# Patient Record
Sex: Female | Born: 1950 | Race: White | Hispanic: No | Marital: Married | Smoking: Never smoker
Health system: Southern US, Community
[De-identification: ages and names within clinical notes are randomized; demographics above are authoritative.]

## PROBLEM LIST (undated history)

## (undated) DIAGNOSIS — E785 Hyperlipidemia, unspecified: Secondary | ICD-10-CM

## (undated) DIAGNOSIS — K219 Gastro-esophageal reflux disease without esophagitis: Secondary | ICD-10-CM

## (undated) DIAGNOSIS — I10 Essential (primary) hypertension: Secondary | ICD-10-CM

## (undated) HISTORY — DX: Essential (primary) hypertension: I10

## (undated) HISTORY — DX: Gastro-esophageal reflux disease without esophagitis: K21.9

## (undated) HISTORY — PX: BUNIONECTOMY: SHX129

## (undated) HISTORY — PX: WRIST SURGERY: SHX841

## (undated) HISTORY — PX: ABDOMINAL HYSTERECTOMY: SHX81

## (undated) HISTORY — DX: Hyperlipidemia, unspecified: E78.5

---

## 1999-05-24 ENCOUNTER — Other Ambulatory Visit: Admission: RE | Admit: 1999-05-24 | Discharge: 1999-05-24 | Payer: Self-pay | Admitting: Family Medicine

## 1999-06-23 ENCOUNTER — Ambulatory Visit (HOSPITAL_COMMUNITY): Admission: RE | Admit: 1999-06-23 | Discharge: 1999-06-23 | Payer: Self-pay | Admitting: Family Medicine

## 1999-06-23 ENCOUNTER — Encounter: Payer: Self-pay | Admitting: Family Medicine

## 2000-07-03 ENCOUNTER — Other Ambulatory Visit: Admission: RE | Admit: 2000-07-03 | Discharge: 2000-07-03 | Payer: Self-pay | Admitting: Family Medicine

## 2000-07-05 ENCOUNTER — Ambulatory Visit (HOSPITAL_COMMUNITY): Admission: RE | Admit: 2000-07-05 | Discharge: 2000-07-05 | Payer: Self-pay | Admitting: Family Medicine

## 2000-07-05 ENCOUNTER — Encounter: Payer: Self-pay | Admitting: Family Medicine

## 2001-07-19 ENCOUNTER — Other Ambulatory Visit: Admission: RE | Admit: 2001-07-19 | Discharge: 2001-07-19 | Payer: Self-pay | Admitting: Obstetrics and Gynecology

## 2001-10-15 ENCOUNTER — Encounter (INDEPENDENT_AMBULATORY_CARE_PROVIDER_SITE_OTHER): Payer: Self-pay | Admitting: Specialist

## 2001-10-15 ENCOUNTER — Inpatient Hospital Stay (HOSPITAL_COMMUNITY): Admission: RE | Admit: 2001-10-15 | Discharge: 2001-10-18 | Payer: Self-pay | Admitting: Obstetrics & Gynecology

## 2002-11-18 ENCOUNTER — Other Ambulatory Visit: Admission: RE | Admit: 2002-11-18 | Discharge: 2002-11-18 | Payer: Self-pay | Admitting: Obstetrics & Gynecology

## 2003-11-23 ENCOUNTER — Other Ambulatory Visit: Admission: RE | Admit: 2003-11-23 | Discharge: 2003-11-23 | Payer: Self-pay | Admitting: Obstetrics & Gynecology

## 2004-11-25 ENCOUNTER — Other Ambulatory Visit: Admission: RE | Admit: 2004-11-25 | Discharge: 2004-11-25 | Payer: Self-pay | Admitting: Obstetrics & Gynecology

## 2005-12-20 ENCOUNTER — Other Ambulatory Visit: Admission: RE | Admit: 2005-12-20 | Discharge: 2005-12-20 | Payer: Self-pay | Admitting: Obstetrics & Gynecology

## 2008-12-12 ENCOUNTER — Emergency Department (HOSPITAL_COMMUNITY): Admission: EM | Admit: 2008-12-12 | Discharge: 2008-12-12 | Payer: Self-pay | Admitting: Emergency Medicine

## 2011-01-12 ENCOUNTER — Encounter: Payer: Self-pay | Admitting: Nurse Practitioner

## 2011-01-12 DIAGNOSIS — N393 Stress incontinence (female) (male): Secondary | ICD-10-CM | POA: Insufficient documentation

## 2011-01-12 DIAGNOSIS — IMO0002 Reserved for concepts with insufficient information to code with codable children: Secondary | ICD-10-CM

## 2011-01-12 DIAGNOSIS — E785 Hyperlipidemia, unspecified: Secondary | ICD-10-CM | POA: Insufficient documentation

## 2011-01-12 DIAGNOSIS — K222 Esophageal obstruction: Secondary | ICD-10-CM

## 2011-01-12 DIAGNOSIS — I10 Essential (primary) hypertension: Secondary | ICD-10-CM | POA: Insufficient documentation

## 2011-01-12 DIAGNOSIS — J309 Allergic rhinitis, unspecified: Secondary | ICD-10-CM | POA: Insufficient documentation

## 2011-01-12 DIAGNOSIS — K219 Gastro-esophageal reflux disease without esophagitis: Secondary | ICD-10-CM | POA: Insufficient documentation

## 2011-01-19 LAB — DIFFERENTIAL
Basophils Absolute: 0 10*3/uL (ref 0.0–0.1)
Basophils Relative: 0 % (ref 0–1)
Monocytes Absolute: 0.4 10*3/uL (ref 0.1–1.0)
Neutro Abs: 12.9 10*3/uL — ABNORMAL HIGH (ref 1.7–7.7)

## 2011-01-19 LAB — CBC
Hemoglobin: 15.4 g/dL — ABNORMAL HIGH (ref 12.0–15.0)
MCHC: 34.6 g/dL (ref 30.0–36.0)
RDW: 12.8 % (ref 11.5–15.5)

## 2011-01-19 LAB — BASIC METABOLIC PANEL
CO2: 20 mEq/L (ref 19–32)
Calcium: 9.9 mg/dL (ref 8.4–10.5)
Creatinine, Ser: 0.86 mg/dL (ref 0.4–1.2)
Glucose, Bld: 131 mg/dL — ABNORMAL HIGH (ref 70–99)

## 2011-02-24 NOTE — Discharge Summary (Signed)
[facility] of [facility]  Patient:    MARGREE, GIMBEL Visit Number: [id number] MRN: [id number]          Service Type: GYN Location: 9300 [id number] Attending Physician:  Minette Headland Dictated by:   Freddy Finner, M.D. Admit Date:  10/15/2001 Discharge Date: 10/18/2001                             Discharge Summary  DISCHARGE DIAGNOSES:          1. Uterine leiomyomata.                               2. Cystocele with prolapse.                               3. Uterine prolapse.                               4. Second-degree rectocele.  OPERATIVE PROCEDURE:          Total vaginal hysterectomy, bilateral salpingo-oophorectomy, anterior-posterior colporrhaphy.  INTRAOPERATIVE AND POSTOPERATIVE COMPLICATIONS:  None.  DISPOSITION:                  The patient was voiding without difficulty, having adequate void and minimal residual volumes at the time of her discharge on the third postoperative day.  Her catheter was removed at that time.  She was discharged home to follow up in the office in 10 days.  She is to take a regular diet.  She is to have progressively increasing physical activity but no vaginal entry.  She is to report fever or heavy bleeding.  She is to take Percocet and/or ibuprofen as needed for postoperative pain.  She is to continue Premarin 0.625 mg a day.  She is to return to my office in two weeks for postoperative followup.  HISTORY OF PRESENT ILLNESS:  Details of the present illness, past history, family history, review of systems, and physical recorded in the admission note.  Basically, the admission physical findings were remarkable for those abnormalities noted in the postoperative diagnosis; that is, cystocele, rectocele, uterine prolapse, and fibroids.  LABORATORY DATA:              Laboratory data during this admission includes a preoperative hemoglobin of 14.8, postoperative hemoglobin of 11.2 on the first postoperative day,  11.3 on the second postoperative day.  Her prothrombin time, PTT, and INR were normal on admission.  Urinalysis was normal on admission.  HOSPITAL COURSE:              The patient was admitted on the morning of surgery.  She was treated perioperatively wit IV antibiotics and PAS hose. The above-described procedure was accomplished without difficulty.  Her postoperative course was uncomplicated.  She had fairly rapid return of bladder function and by the morning of the third postoperative day she was ambulating without difficulty, having adequate bowel and bladder function. Her condition was good.  She was discharged with disposition as noted above. Dictated by:   Freddy Finner, M.D. Attending Physician:  Minette Headland DD:  10/25/01 TD:  10/28/01 Job: [id number]

## 2011-02-24 NOTE — Op Note (Signed)
[facility] of [facility]  Patient:    Deborah Adams, Deborah Adams Visit Number: [id number] MRN: [id number]          Service Type: GYN Location: 9300 [id number] Attending Physician:  Minette Headland Dictated by:   Freddy Finner, M.D. Proc. Date: 10/15/01 Admit Date:  10/15/2001                             Operative Report  PREOPERATIVE DIAGNOSIS:       Cystocele with prolapse, uterine prolapse, second-degree rectocele.  POSTOPERATIVE DIAGNOSIS:      Cystocele with prolapse, uterine prolapse, second-degree rectocele.  OPERATION:                    Total vaginal hysterectomy, bilateral salpingo-oophorectomy.  SURGEON:                      Freddy Finner, M.D.  ASSISTANT:                    ______  ESTIMATED INTRAOPERATIVE: BLOOD LOSS:                   200 cc.  INTRAOPERATIVE COMPLICATIONS: None.  HISTORY OF PRESENT ILLNESS:   Details of the present illness are recorded in the admission note.  DESCRIPTION OF PROCEDURE:  The patient was admitted on the morning of surgery. She was given an IV bolus of Cefotan.  She was placed in PAS hose.  She was brought to the operating room and there placed under adequate general endotracheal anesthesia, placed in the dorsal lithotomy position.  The lower abdomen, perineum, and vagina were prepped with Betadine scrub followed by Betadine solution.  Sterile drapes were applied.  A posterior weighted vaginal retractor was placed.  The cervix was grasped with Christella Hartigan tenaculum. Colpotomy incision was made while tenting the mucosa posterior to the cervix. The cervix was circumscribed with the scalpel.  Using the LigaSure system, the uterosacrals, cardinal ligaments, bladder pillars, and pedicles were all developed and sealed.  The anterior peritoneum was entered sharply.  Vessel pedicles retracted with the LigaSure system on each side.  An additional pedicle was taken above the vessels on each side.  The uterus was  delivered through the entroitus.  The utero-ovarian pedicles on each side were clamped with curved Heaneys and the uterus removed.  A Tanja Port was used to secure the tubes and ovaries and the infundibular pelvic ligament on each side was cross-clamped with the LigaSure system and sealed and the tube and ovary on each side removed.  Hemostasis was complete.  The uterosacrals were anchored to the vaginal mucosa with a mattress suture of 0 Monocryl.  Uterosacrals were plicated with 0 Monocryl.  The cuff was closed over the posterior two-thirds with figure-of-eights of 0 Monocryl.  The edges in the anterior mucosa were grasped.  The mucosa was tied in the midline over the bladder and the bladder separated from the mucosa with blunt and sharp dissection with a midline incision carried to approximately 2 cm proximal to the urethra meatus.  After carefully developing the vesicovaginal fascia, plication sutures of 0 Monocryl were placed to elevate the urethrovesical angle and to reduce the cystocele. Segments of vaginal mucosa were removed.  The incision was closed with running 2-0 Monocryl.  The cuff was completely closed at this point with figure-of-eights of 0 Monocryl.  Attention was turned posteriorly.  The  fourchette was grasped on each side with an Allis and a pyramidal-shaped segment of skin was excised from the perineal body with apex inferiorly.  The mucosa overlying the rectum was then developed sharply and bluntly and an incision made in the midline.  This was continued until it reached the level of the uterosacrals.  Plication sutures of 0 Monocryl were then placed to approximate pararectal tissues and recreate the rectovaginal septum.  Levators were elevated and sutured with 0 Monocryl suture.  The mucosa was closed with running 3-0 Monocryl in a fashion similar to an episiotomy.  Hemostasis was complete.  For that reason, it was elected not to place a vaginal pack.  A Foley catheter  was inserted and the bladder filled with 300 cc of irrigating solution.  A suprapubic catheter was placed and anchored to the skin with 3-0 nylon.  The patient tolerated the operative procedure well.  She was awakened and taken to recovery in good condition. Dictated by:   Freddy Finner, M.D. Attending Physician:  Minette Headland DD:  10/15/01 TD:  10/15/01 Job: [id number]

## 2011-02-24 NOTE — H&P (Signed)
[facility] of [facility]  Patient:    Deborah Adams, Deborah Adams Visit Number: [id number] MRN: [id number]          Service Type: Attending:  Freddy Finner, M.D. Dictated by:   Freddy Finner, M.D. Adm. Date:  10/15/01                           History and Physical  SOCIAL SECURITY#:             [id number]  ADMITTING DIAGNOSES:          1. Uterine prolapse.                               2. Cystocele with prolapse.                               3. Second degree rectocele.  HISTORY OF PRESENT ILLNESS:   The patient is a 60 year old married white female, gravida 2 para 2, who presented to my office in November 2002 complaining of uterine prolapse and bladder prolapse.  To examination she did indeed have a third degree cystocele with prolapse through the vaginal introitus.  She had prolapse of a rectocele to the vaginal introitus and there was marked uterine descensus.  Bimanual examination at that time revealed no other abnormalities.  The uterus itself was normal.  There were no palpable adnexal or rectal masses.  REVIEW OF SYSTEMS:            Her Review Of Systems at that time was remarkable for two very distinct types of urinary incontinence, one which apparently is an urge incontinence.  She also has stress urinary incontinence. I have discussed with her at great length the fact that the urge incontinence may not, in fact, be helped by her surgical procedure at all.  Her primary concern is of prolapse and requests to have this surgically repaired.  She is now admitted for that purpose.                                Her current Review Of Systems is otherwise negative.  No cardiopulmonary, GI, or GU complaints.  PAST MEDICAL HISTORY:         The patient is known to have gastroesophageal reflux, for which she is treated by Dr. Demetrio Lapping.  She has no other known significant medical illnesses.  MEDICATIONS:                  1. Prevacid 15 mg q.d.            2. Climara 0.05 skin patch.                               3. Provera for ten days out of each cycle.                                She is on no other medications.  PAST SURGICAL HISTORY:        D&Cs in 1992 and 1996.  ALLERGIES:                    1. PENICILLIN.  2. SULFA.  FAMILY HISTORY:               Noncontributory.  PHYSICAL EXAMINATION:  HEENT:                        Grossly within normal limits.  VITAL SIGNS:                  Blood pressure in the office was 140/84.  NECK:                         Thyroid gland not palpably enlarged.  CHEST:                        Clear to auscultation.  HEART:                        Normal sinus rhythm without murmurs, rubs, or gallops.  ABDOMEN:                      Soft, nontender, without appreciable organomegaly or palpable masses.  EXTREMITIES:                  Without clubbing, cyanosis, or edema.  PELVIC:                       Examination as described above.  ASSESSMENT:                   Marked pelvic relaxation with cystocele with prolapse, rectocele, and uterine prolapse.  PLAN:                         Total vaginal hysterectomy and bilateral salpingo-oophorectomy with anterior and posterior vaginal repair.  The patient has reviewed a video in the office describing the operative procedure, including the potential complications. Dictated by:   Freddy Finner, M.D. Attending:  Freddy Finner, M.D. DD:  10/14/01 TD:  10/14/01 Job: [id number]

## 2013-06-24 ENCOUNTER — Telehealth: Payer: Self-pay | Admitting: Physician Assistant

## 2013-06-24 ENCOUNTER — Ambulatory Visit (INDEPENDENT_AMBULATORY_CARE_PROVIDER_SITE_OTHER): Payer: 59 | Admitting: Family Medicine

## 2013-06-24 ENCOUNTER — Encounter: Payer: Self-pay | Admitting: Family Medicine

## 2013-06-24 VITALS — BP 124/82 | HR 68 | Temp 98.3°F | Ht 62.0 in | Wt 152.4 lb

## 2013-06-24 DIAGNOSIS — I10 Essential (primary) hypertension: Secondary | ICD-10-CM

## 2013-06-24 DIAGNOSIS — K219 Gastro-esophageal reflux disease without esophagitis: Secondary | ICD-10-CM

## 2013-06-24 DIAGNOSIS — J069 Acute upper respiratory infection, unspecified: Secondary | ICD-10-CM

## 2013-06-24 DIAGNOSIS — E785 Hyperlipidemia, unspecified: Secondary | ICD-10-CM

## 2013-06-24 MED ORDER — OMEPRAZOLE 20 MG PO CPDR: 20.0000 mg | DELAYED_RELEASE_CAPSULE | Freq: Every day | ORAL | Status: DC

## 2013-06-24 MED ORDER — ATORVASTATIN CALCIUM 40 MG PO TABS: 40.0000 mg | ORAL_TABLET | Freq: Every day | ORAL | Status: DC

## 2013-06-24 MED ORDER — AZITHROMYCIN 250 MG PO TABS: ORAL_TABLET | ORAL | Status: DC

## 2013-06-24 MED ORDER — LISINOPRIL 10 MG PO TABS: 20.0000 mg | ORAL_TABLET | Freq: Every day | ORAL | Status: DC

## 2013-06-24 MED ORDER — LISINOPRIL 20 MG PO TABS: 20.0000 mg | ORAL_TABLET | Freq: Every day | ORAL | Status: DC

## 2013-06-24 NOTE — Patient Instructions (Signed)

## 2013-06-24 NOTE — Telephone Encounter (Signed)
Sore throat, earache, cough, chills, post nasal drainage. Symptoms x 3 days . Has taken Aspirin and gargled with warm salt water. She wants to get better before flu shots next week.  Suggested she take an antihistamine such as loratadine and use warm salt water gargles. Decongestants would help ear pressure but she has high blood pressure.  HTN is controlled with medication.  Patient prefers to be seen instead of continuing to try OTC treatments.   Appt scheduled.  Patient aware.

## 2013-06-24 NOTE — Progress Notes (Signed)
  Subjective:    Patient ID: Deborah Adams, female    DOB: 03-10-1951, 62 y.o.   MRN: [id number]  HPI This 62 y.o. female presents for evaluation of uri sx's for over a week. She needs refills on her htn meds, hyperlipidemia meds, and acid reflux meds. She has been seen in 3/14 and has had labs.   Review of Systems C/o cough and congestion No chest pain, SOB, HA, dizziness, vision change, N/V, diarrhea, constipation, dysuria, urinary urgency or frequency, myalgias, arthralgias or rash.     Objective:   Physical Exam Vital signs noted  Well developed well nourished female.  HEENT - Head atraumatic Normocephalic                Eyes - PERRLA, Conjuctiva - clear Sclera- Clear EOMI                Ears - EAC's Wnl TM's Wnl Gross Hearing WNL                Nose - Nares patent                 Throat - oropharanx wnl Respiratory - Lungs CTA bilateral Cardiac - RRR S1 and S2 without murmur GI - Abdomen soft Nontender and bowel sounds active x 4 Extremities - No edema. Neuro - Grossly intact.       Assessment & Plan:  Acute upper respiratory infections of unspecified site - Plan: azithromycin (ZITHROMAX) 250 MG tablet  GERD (gastroesophageal reflux disease) - Plan: omeprazole (PRILOSEC) 20 MG capsule  Essential hypertension, benign - Plan: DISCONTINUED: lisinopril (PRINIVIL,ZESTRIL) 10 MG tablet  Other and unspecified hyperlipidemia - Plan: atorvastatin (LIPITOR) 40 MG tablet  Follow up in 6 months.

## 2013-11-11 ENCOUNTER — Telehealth: Payer: Self-pay | Admitting: Family Medicine

## 2013-11-12 ENCOUNTER — Other Ambulatory Visit: Payer: Self-pay | Admitting: Family Medicine

## 2013-11-12 MED ORDER — ATORVASTATIN CALCIUM 40 MG PO TABS: 40.0000 mg | ORAL_TABLET | Freq: Every day | ORAL | Status: DC

## 2013-11-12 NOTE — Telephone Encounter (Signed)
Will send in generic rx for lipitor and does not NTBS

## 2013-11-13 NOTE — Telephone Encounter (Signed)
Generic script sent in ,no need to be seen.(LM)

## 2013-11-17 ENCOUNTER — Ambulatory Visit (INDEPENDENT_AMBULATORY_CARE_PROVIDER_SITE_OTHER): Payer: Medicare HMO | Admitting: Family Medicine

## 2013-11-17 ENCOUNTER — Encounter: Payer: Self-pay | Admitting: Family Medicine

## 2013-11-17 VITALS — BP 126/76 | HR 72 | Temp 100.2°F | Ht 62.0 in | Wt 155.0 lb

## 2013-11-17 DIAGNOSIS — J069 Acute upper respiratory infection, unspecified: Secondary | ICD-10-CM

## 2013-11-17 DIAGNOSIS — R509 Fever, unspecified: Secondary | ICD-10-CM

## 2013-11-17 DIAGNOSIS — R059 Cough, unspecified: Secondary | ICD-10-CM

## 2013-11-17 DIAGNOSIS — R05 Cough: Secondary | ICD-10-CM

## 2013-11-17 LAB — POCT INFLUENZA A/B
Influenza A, POC: NEGATIVE
Influenza B, POC: NEGATIVE

## 2013-11-17 MED ORDER — LEVOFLOXACIN 500 MG PO TABS
500.0000 mg | ORAL_TABLET | Freq: Every day | ORAL | Status: DC
Start: 2013-11-17 — End: 2014-02-20

## 2013-11-17 MED ORDER — BENZONATATE 100 MG PO CAPS: 100.0000 mg | ORAL_CAPSULE | Freq: Three times a day (TID) | ORAL | Status: DC | PRN

## 2013-11-17 NOTE — Progress Notes (Signed)
   Subjective:    Patient ID: Deborah Adams, female    DOB: Apr 04, 1951, 63 y.o.   MRN: [id number]  HPI This 63 y.o. female presents for evaluation of URI and cough.  She is having fever and arthralgias and myalgias.  She is c/o malaise.  She states she has a cough and she has been having some wheezing.   Review of Systems C/o arthralgias, myalgias, fever, and malaise. No chest pain, SOB, HA, dizziness, vision change, N/V, diarrhea, constipation, dysuria, urinary urgency or frequency, myalgias, arthralgias or rash.     Objective:   Physical Exam Vital signs noted  Well developed well nourished female.  HEENT - Head atraumatic Normocephalic                Eyes - PERRLA, Conjuctiva - clear Sclera- Clear EOMI                Ears - EAC's Wnl TM's Wnl Gross Hearing WNL                Nose - Nares patent                 Throat - oropharanx wnl Respiratory - Lungs CTA bilateral Cardiac - RRR S1 and S2 without murmur GI - Abdomen soft Nontender and bowel soun       Assessment & Plan:  Fever - Plan: POCT Influenza A/B, benzonatate (TESSALON PERLES) 100 MG capsule, levofloxacin (LEVAQUIN) 500 MG tablet  Cough - Plan: POCT Influenza A/B, benzonatate (TESSALON PERLES) 100 MG capsule, levofloxacin (LEVAQUIN) 500 MG tablet  URI (upper respiratory infection) - Plan: benzonatate (TESSALON PERLES) 100 MG capsule, levofloxacin (LEVAQUIN) 500 MG tablet  Push po fluids, rest, tylenol and motrin otc prn as directed for fever, arthralgias, and myalgias.  Follow up prn if sx's continue or persist.  Lysbeth Penner FNP

## 2013-11-17 NOTE — Patient Instructions (Signed)

## 2014-02-20 ENCOUNTER — Ambulatory Visit (INDEPENDENT_AMBULATORY_CARE_PROVIDER_SITE_OTHER): Payer: Medicare HMO | Admitting: Family Medicine

## 2014-02-20 ENCOUNTER — Encounter: Payer: Self-pay | Admitting: Family Medicine

## 2014-02-20 VITALS — BP 131/90 | HR 68 | Temp 98.3°F | Ht 62.5 in | Wt 155.0 lb

## 2014-02-20 DIAGNOSIS — W57XXXA Bitten or stung by nonvenomous insect and other nonvenomous arthropods, initial encounter: Secondary | ICD-10-CM

## 2014-02-20 DIAGNOSIS — T148 Other injury of unspecified body region: Secondary | ICD-10-CM

## 2014-02-20 MED ORDER — DOXYCYCLINE HYCLATE 100 MG PO TABS: 100.0000 mg | ORAL_TABLET | Freq: Two times a day (BID) | ORAL | Status: DC

## 2014-02-20 NOTE — Progress Notes (Signed)
   Subjective:    Patient ID: Deborah Adams, female    DOB: 28-Aug-1951, 63 y.o.   MRN: [id number]  HPI  This 63 y.o. female presents for evaluation of tick bite on right upper back.  Review of Systems    No chest pain, SOB, HA, dizziness, vision change, N/V, diarrhea, constipation, dysuria, urinary urgency or frequency, myalgias, arthralgias or rash.  Objective:   Physical Exam Vital signs noted  Well developed well nourished female.  HEENT - Head atraumatic Normocephalic Respiratory - Lungs CTA bilateral Cardiac - RRR S1 and S2 without murmur Right upper back with erythema and central clearing approx 1 cm in diameter       Assessment & Plan:  Tick bite - Plan: Lyme Ab/Western Blot Reflex, Rocky mtn spotted fvr abs pnl(IgG+IgM), doxycycline (VIBRA-TABS) 100 MG tablet  Lysbeth Penner FNP

## 2014-02-24 LAB — LYME AB/WESTERN BLOT REFLEX
LYME DISEASE AB, QUANT, IGM: <0.8 index (ref 0.00–0.79)
Lyme IgG/IgM Ab: <0.91 {ISR} (ref 0.00–0.90)

## 2014-02-24 LAB — ROCKY MTN SPOTTED FVR ABS PNL(IGG+IGM)
RMSF IgG: NEGATIVE
RMSF IgM: 0.24 index (ref 0.00–0.89)

## 2014-02-25 ENCOUNTER — Telehealth: Payer: Self-pay | Admitting: Family Medicine

## 2014-02-25 NOTE — Telephone Encounter (Signed)
Message copied by Waverly Ferrari on Wed Feb 25, 2014 11:09 AM ------      Message from: Lysbeth Penner      Created: Tue Feb 24, 2014  5:43 PM       Rmsf and lymes titre negative ------

## 2014-03-07 ENCOUNTER — Encounter: Payer: Self-pay | Admitting: *Deleted

## 2014-04-29 ENCOUNTER — Ambulatory Visit (INDEPENDENT_AMBULATORY_CARE_PROVIDER_SITE_OTHER): Payer: Medicare HMO | Admitting: Family Medicine

## 2014-04-29 ENCOUNTER — Encounter: Payer: Self-pay | Admitting: Family Medicine

## 2014-04-29 VITALS — BP 138/83 | HR 76 | Temp 99.2°F | Ht 62.5 in | Wt 153.2 lb

## 2014-04-29 DIAGNOSIS — J069 Acute upper respiratory infection, unspecified: Secondary | ICD-10-CM

## 2014-04-29 DIAGNOSIS — R059 Cough, unspecified: Secondary | ICD-10-CM

## 2014-04-29 DIAGNOSIS — R05 Cough: Secondary | ICD-10-CM

## 2014-04-29 MED ORDER — LEVOFLOXACIN 500 MG PO TABS: 500.0000 mg | ORAL_TABLET | Freq: Every day | ORAL | Status: DC

## 2014-04-29 MED ORDER — HYDROCODONE-HOMATROPINE 5-1.5 MG/5ML PO SYRP: 5.0000 mL | ORAL_SOLUTION | Freq: Three times a day (TID) | ORAL | Status: DC | PRN

## 2014-04-29 MED ORDER — METHYLPREDNISOLONE ACETATE 80 MG/ML IJ SUSP
80.0000 mg | Freq: Once | INTRAMUSCULAR | Status: AC
Start: 2014-04-29 — End: 2014-04-29
  Administered 2014-04-29: 80 mg via INTRAMUSCULAR

## 2014-04-29 NOTE — Progress Notes (Signed)
   Subjective:    Patient ID: Deborah Adams, female    DOB: 02/27/1951, 63 y.o.   MRN: [id number]  HPI  This 63 y.o. female presents for evaluation of uri complaints and persistent cough.  Review of Systems    No chest pain, SOB, HA, dizziness, vision change, N/V, diarrhea, constipation, dysuria, urinary urgency or frequency, myalgias, arthralgias or rash.  Objective:   Physical Exam Vital signs noted  Well developed well nourished female.  HEENT - Head atraumatic Normocephalic                Eyes - PERRLA, Conjuctiva - clear Sclera- Clear EOMI                Ears - EAC's Wnl TM's Wnl Gross Hearing WNL                Nose - Nares patent                 Throat - oropharanx wnl Respiratory - Lungs CTA bilateral Cardiac - RRR S1 and S2 without murmur GI - Abdomen soft Nontender and bowel sounds active x 4 Extremities - No edema. Neuro - Grossly intact.       Assessment & Plan:  Cough - Plan: levofloxacin (LEVAQUIN) 500 MG tablet, methylPREDNISolone acetate (DEPO-MEDROL) injection 80 mg, HYDROcodone-homatropine (HYCODAN) 5-1.5 MG/5ML syrup  URI (upper respiratory infection) - Plan: levofloxacin (LEVAQUIN) 500 MG tablet, methylPREDNISolone acetate (DEPO-MEDROL) injection 80 mg, HYDROcodone-homatropine (HYCODAN) 5-1.5 MG/5ML syrup  Lysbeth Penner FNP

## 2014-06-10 ENCOUNTER — Other Ambulatory Visit: Payer: Self-pay | Admitting: Family Medicine

## 2014-06-11 NOTE — Telephone Encounter (Signed)
No lipids in epic. Last ov 7/15.

## 2014-08-28 ENCOUNTER — Other Ambulatory Visit: Payer: Self-pay | Admitting: Obstetrics and Gynecology

## 2014-08-31 LAB — CYTOLOGY - PAP

## 2015-09-07 ENCOUNTER — Other Ambulatory Visit: Payer: Self-pay | Admitting: Obstetrics & Gynecology

## 2015-09-07 DIAGNOSIS — R928 Other abnormal and inconclusive findings on diagnostic imaging of breast: Secondary | ICD-10-CM

## 2015-10-22 ENCOUNTER — Ambulatory Visit
Admission: RE | Admit: 2015-10-22 | Discharge: 2015-10-22 | Disposition: A | Discharge status: Home / Self Care | Payer: PPO | Source: Ambulatory Visit | Attending: Obstetrics & Gynecology | Admitting: Obstetrics & Gynecology

## 2015-10-22 DIAGNOSIS — R928 Other abnormal and inconclusive findings on diagnostic imaging of breast: Secondary | ICD-10-CM

## 2015-10-22 DIAGNOSIS — J320 Chronic maxillary sinusitis: Secondary | ICD-10-CM | POA: Diagnosis not present

## 2015-10-22 DIAGNOSIS — R922 Inconclusive mammogram: Secondary | ICD-10-CM | POA: Diagnosis not present

## 2015-11-01 ENCOUNTER — Encounter: Payer: Self-pay | Admitting: Obstetrics & Gynecology

## 2015-12-22 DIAGNOSIS — J0100 Acute maxillary sinusitis, unspecified: Secondary | ICD-10-CM | POA: Diagnosis not present

## 2016-03-23 DIAGNOSIS — M7061 Trochanteric bursitis, right hip: Secondary | ICD-10-CM | POA: Diagnosis not present

## 2016-03-23 DIAGNOSIS — M5431 Sciatica, right side: Secondary | ICD-10-CM | POA: Diagnosis not present

## 2016-09-27 DIAGNOSIS — Z1231 Encounter for screening mammogram for malignant neoplasm of breast: Secondary | ICD-10-CM | POA: Diagnosis not present

## 2016-09-27 DIAGNOSIS — Z1272 Encounter for screening for malignant neoplasm of vagina: Secondary | ICD-10-CM | POA: Diagnosis not present

## 2016-09-27 DIAGNOSIS — R351 Nocturia: Secondary | ICD-10-CM | POA: Diagnosis not present

## 2016-09-27 DIAGNOSIS — Z6830 Body mass index (BMI) 30.0-30.9, adult: Secondary | ICD-10-CM | POA: Diagnosis not present

## 2016-09-27 DIAGNOSIS — Z124 Encounter for screening for malignant neoplasm of cervix: Secondary | ICD-10-CM | POA: Diagnosis not present

## 2016-11-02 DIAGNOSIS — M5431 Sciatica, right side: Secondary | ICD-10-CM | POA: Diagnosis not present

## 2016-11-02 DIAGNOSIS — J309 Allergic rhinitis, unspecified: Secondary | ICD-10-CM | POA: Diagnosis not present

## 2016-11-02 DIAGNOSIS — K219 Gastro-esophageal reflux disease without esophagitis: Secondary | ICD-10-CM | POA: Diagnosis not present

## 2016-11-02 DIAGNOSIS — I10 Essential (primary) hypertension: Secondary | ICD-10-CM | POA: Diagnosis not present

## 2016-11-02 DIAGNOSIS — Z23 Encounter for immunization: Secondary | ICD-10-CM | POA: Diagnosis not present

## 2016-11-02 DIAGNOSIS — Z0000 Encounter for general adult medical examination without abnormal findings: Secondary | ICD-10-CM | POA: Diagnosis not present

## 2016-11-02 DIAGNOSIS — E782 Mixed hyperlipidemia: Secondary | ICD-10-CM | POA: Diagnosis not present

## 2016-11-02 DIAGNOSIS — Z1159 Encounter for screening for other viral diseases: Secondary | ICD-10-CM | POA: Diagnosis not present

## 2016-11-02 DIAGNOSIS — Z1211 Encounter for screening for malignant neoplasm of colon: Secondary | ICD-10-CM | POA: Diagnosis not present

## 2016-12-01 DIAGNOSIS — D2271 Melanocytic nevi of right lower limb, including hip: Secondary | ICD-10-CM | POA: Diagnosis not present

## 2016-12-01 DIAGNOSIS — D485 Neoplasm of uncertain behavior of skin: Secondary | ICD-10-CM | POA: Diagnosis not present

## 2016-12-01 DIAGNOSIS — L813 Cafe au lait spots: Secondary | ICD-10-CM | POA: Diagnosis not present

## 2016-12-01 DIAGNOSIS — L821 Other seborrheic keratosis: Secondary | ICD-10-CM | POA: Diagnosis not present

## 2016-12-01 DIAGNOSIS — L815 Leukoderma, not elsewhere classified: Secondary | ICD-10-CM | POA: Diagnosis not present

## 2016-12-01 DIAGNOSIS — D225 Melanocytic nevi of trunk: Secondary | ICD-10-CM | POA: Diagnosis not present

## 2016-12-01 DIAGNOSIS — D2261 Melanocytic nevi of right upper limb, including shoulder: Secondary | ICD-10-CM | POA: Diagnosis not present

## 2016-12-01 DIAGNOSIS — D1801 Hemangioma of skin and subcutaneous tissue: Secondary | ICD-10-CM | POA: Diagnosis not present

## 2016-12-01 DIAGNOSIS — D2239 Melanocytic nevi of other parts of face: Secondary | ICD-10-CM | POA: Diagnosis not present

## 2016-12-01 DIAGNOSIS — L718 Other rosacea: Secondary | ICD-10-CM | POA: Diagnosis not present

## 2017-03-21 DIAGNOSIS — D1801 Hemangioma of skin and subcutaneous tissue: Secondary | ICD-10-CM | POA: Diagnosis not present

## 2017-03-21 DIAGNOSIS — D1039 Benign neoplasm of other parts of mouth: Secondary | ICD-10-CM | POA: Diagnosis not present

## 2017-05-23 DIAGNOSIS — L739 Follicular disorder, unspecified: Secondary | ICD-10-CM | POA: Diagnosis not present

## 2017-05-24 DIAGNOSIS — L255 Unspecified contact dermatitis due to plants, except food: Secondary | ICD-10-CM | POA: Diagnosis not present

## 2017-09-06 DIAGNOSIS — J0100 Acute maxillary sinusitis, unspecified: Secondary | ICD-10-CM | POA: Diagnosis not present

## 2017-10-10 DIAGNOSIS — Z6830 Body mass index (BMI) 30.0-30.9, adult: Secondary | ICD-10-CM | POA: Diagnosis not present

## 2017-10-10 DIAGNOSIS — Z01419 Encounter for gynecological examination (general) (routine) without abnormal findings: Secondary | ICD-10-CM | POA: Diagnosis not present

## 2017-10-10 DIAGNOSIS — N762 Acute vulvitis: Secondary | ICD-10-CM | POA: Diagnosis not present

## 2017-10-10 DIAGNOSIS — Z1231 Encounter for screening mammogram for malignant neoplasm of breast: Secondary | ICD-10-CM | POA: Diagnosis not present

## 2017-11-26 DIAGNOSIS — E782 Mixed hyperlipidemia: Secondary | ICD-10-CM | POA: Diagnosis not present

## 2017-11-26 DIAGNOSIS — M19049 Primary osteoarthritis, unspecified hand: Secondary | ICD-10-CM | POA: Diagnosis not present

## 2017-11-26 DIAGNOSIS — Z0000 Encounter for general adult medical examination without abnormal findings: Secondary | ICD-10-CM | POA: Diagnosis not present

## 2017-11-26 DIAGNOSIS — K219 Gastro-esophageal reflux disease without esophagitis: Secondary | ICD-10-CM | POA: Diagnosis not present

## 2017-11-26 DIAGNOSIS — I10 Essential (primary) hypertension: Secondary | ICD-10-CM | POA: Diagnosis not present

## 2017-11-26 DIAGNOSIS — L719 Rosacea, unspecified: Secondary | ICD-10-CM | POA: Diagnosis not present

## 2017-11-26 DIAGNOSIS — E669 Obesity, unspecified: Secondary | ICD-10-CM | POA: Diagnosis not present

## 2017-11-26 DIAGNOSIS — J309 Allergic rhinitis, unspecified: Secondary | ICD-10-CM | POA: Diagnosis not present

## 2017-11-26 DIAGNOSIS — M8580 Other specified disorders of bone density and structure, unspecified site: Secondary | ICD-10-CM | POA: Diagnosis not present

## 2017-12-04 DIAGNOSIS — Z1212 Encounter for screening for malignant neoplasm of rectum: Secondary | ICD-10-CM | POA: Diagnosis not present

## 2017-12-04 DIAGNOSIS — Z1211 Encounter for screening for malignant neoplasm of colon: Secondary | ICD-10-CM | POA: Diagnosis not present

## 2018-02-22 DIAGNOSIS — D1809 Hemangioma of other sites: Secondary | ICD-10-CM | POA: Diagnosis not present

## 2018-07-12 DIAGNOSIS — Z23 Encounter for immunization: Secondary | ICD-10-CM | POA: Diagnosis not present

## 2018-10-30 DIAGNOSIS — Z124 Encounter for screening for malignant neoplasm of cervix: Secondary | ICD-10-CM | POA: Diagnosis not present

## 2018-10-30 DIAGNOSIS — Z1272 Encounter for screening for malignant neoplasm of vagina: Secondary | ICD-10-CM | POA: Diagnosis not present

## 2018-10-30 DIAGNOSIS — Z1231 Encounter for screening mammogram for malignant neoplasm of breast: Secondary | ICD-10-CM | POA: Diagnosis not present

## 2018-10-30 DIAGNOSIS — Z6830 Body mass index (BMI) 30.0-30.9, adult: Secondary | ICD-10-CM | POA: Diagnosis not present

## 2018-12-11 DIAGNOSIS — R52 Pain, unspecified: Secondary | ICD-10-CM | POA: Diagnosis not present

## 2018-12-11 DIAGNOSIS — M546 Pain in thoracic spine: Secondary | ICD-10-CM | POA: Diagnosis not present

## 2018-12-25 DIAGNOSIS — M546 Pain in thoracic spine: Secondary | ICD-10-CM | POA: Insufficient documentation

## 2019-01-03 DIAGNOSIS — M546 Pain in thoracic spine: Secondary | ICD-10-CM | POA: Diagnosis not present

## 2019-01-09 DIAGNOSIS — M546 Pain in thoracic spine: Secondary | ICD-10-CM | POA: Diagnosis not present

## 2019-01-14 DIAGNOSIS — M5137 Other intervertebral disc degeneration, lumbosacral region: Secondary | ICD-10-CM | POA: Diagnosis not present

## 2019-01-14 DIAGNOSIS — M9903 Segmental and somatic dysfunction of lumbar region: Secondary | ICD-10-CM | POA: Diagnosis not present

## 2019-01-14 DIAGNOSIS — L719 Rosacea, unspecified: Secondary | ICD-10-CM | POA: Diagnosis not present

## 2019-01-14 DIAGNOSIS — Z0000 Encounter for general adult medical examination without abnormal findings: Secondary | ICD-10-CM | POA: Diagnosis not present

## 2019-01-14 DIAGNOSIS — M9905 Segmental and somatic dysfunction of pelvic region: Secondary | ICD-10-CM | POA: Diagnosis not present

## 2019-01-14 DIAGNOSIS — E782 Mixed hyperlipidemia: Secondary | ICD-10-CM | POA: Diagnosis not present

## 2019-01-14 DIAGNOSIS — K219 Gastro-esophageal reflux disease without esophagitis: Secondary | ICD-10-CM | POA: Diagnosis not present

## 2019-01-14 DIAGNOSIS — I10 Essential (primary) hypertension: Secondary | ICD-10-CM | POA: Diagnosis not present

## 2019-01-14 DIAGNOSIS — M9904 Segmental and somatic dysfunction of sacral region: Secondary | ICD-10-CM | POA: Diagnosis not present

## 2019-01-14 DIAGNOSIS — E785 Hyperlipidemia, unspecified: Secondary | ICD-10-CM | POA: Diagnosis not present

## 2019-01-14 DIAGNOSIS — J309 Allergic rhinitis, unspecified: Secondary | ICD-10-CM | POA: Diagnosis not present

## 2019-01-16 DIAGNOSIS — M546 Pain in thoracic spine: Secondary | ICD-10-CM | POA: Diagnosis not present

## 2019-01-16 DIAGNOSIS — L820 Inflamed seborrheic keratosis: Secondary | ICD-10-CM | POA: Diagnosis not present

## 2019-01-16 DIAGNOSIS — L578 Other skin changes due to chronic exposure to nonionizing radiation: Secondary | ICD-10-CM | POA: Diagnosis not present

## 2019-01-16 DIAGNOSIS — B351 Tinea unguium: Secondary | ICD-10-CM | POA: Diagnosis not present

## 2019-01-16 DIAGNOSIS — L570 Actinic keratosis: Secondary | ICD-10-CM | POA: Diagnosis not present

## 2019-01-16 DIAGNOSIS — L821 Other seborrheic keratosis: Secondary | ICD-10-CM | POA: Diagnosis not present

## 2019-01-21 DIAGNOSIS — M546 Pain in thoracic spine: Secondary | ICD-10-CM | POA: Diagnosis not present

## 2019-01-21 DIAGNOSIS — S199XXA Unspecified injury of neck, initial encounter: Secondary | ICD-10-CM | POA: Diagnosis not present

## 2019-01-23 DIAGNOSIS — M546 Pain in thoracic spine: Secondary | ICD-10-CM | POA: Diagnosis not present

## 2019-04-21 ENCOUNTER — Ambulatory Visit (INDEPENDENT_AMBULATORY_CARE_PROVIDER_SITE_OTHER): Payer: PPO

## 2019-04-21 ENCOUNTER — Encounter: Payer: Self-pay | Admitting: Podiatry

## 2019-04-21 ENCOUNTER — Other Ambulatory Visit: Payer: Self-pay

## 2019-04-21 ENCOUNTER — Ambulatory Visit: Payer: Medicare HMO | Admitting: Podiatry

## 2019-04-21 VITALS — Temp 98.4°F | Resp 16

## 2019-04-21 DIAGNOSIS — R600 Localized edema: Secondary | ICD-10-CM

## 2019-04-21 DIAGNOSIS — S9032XA Contusion of left foot, initial encounter: Secondary | ICD-10-CM

## 2019-04-21 MED ORDER — HYDROCODONE-ACETAMINOPHEN 10-325 MG PO TABS: 1.0000 | ORAL_TABLET | Freq: Three times a day (TID) | ORAL | 0 refills | Status: DC | PRN

## 2019-04-21 NOTE — Progress Notes (Signed)
   Subjective:    Patient ID: Deborah Adams, female    DOB: 1951/09/13, 68 y.o.   MRN: [id number]  HPI    Review of Systems  All other systems reviewed and are negative.      Objective:   Physical Exam        Assessment & Plan:

## 2019-04-24 NOTE — Progress Notes (Signed)
Subjective:   Patient ID: Deborah Adams, female   DOB: 68 y.o.   MRN: [id number]   HPI Patient presents stating she fell on her left foot and it is been extremely sore and it happened at 9 this morning.  She is very worried that she broke a bone   Review of Systems  All other systems reviewed and are negative.       Objective:  Physical Exam Vitals signs and nursing note reviewed.  Constitutional:      Appearance: She is well-developed.  Pulmonary:     Effort: Pulmonary effort is normal.  Musculoskeletal: Normal range of motion.  Skin:    General: Skin is warm.  Neurological:     Mental Status: She is alert.     Patient presents with significant swelling of the left midfoot I noted digital fusion to be adequate with pulses intact and patient is found to have exquisite discomfort in the outside of the left foot with possibility for fracture associated with this and diminished range of motion noted subtalar midtarsal joint and splinting on the left side     Assessment:  Possibility for fracture versus severe contusion of the left lateral foot     Plan:  H&P condition reviewed and at this point I did go ahead and apply Unna boot air fracture walker in order to try to mobilize and reduce the significant edematous process.  I did discuss possibility for fracture of the cuboid and we may need to get a CT scan of this if it were to not get better and patient will be seen back 3 weeks and was given instructions to leave the Unna boot on 3 days but take it off earlier if it should be a problem.  Reappoint to recheck and strict instructions given to call us if any issues were to occur  X-rays indicate that there is some changes within the cuboid but I do not think there is an acute fracture

## 2019-05-12 ENCOUNTER — Encounter: Payer: Self-pay | Admitting: Podiatry

## 2019-05-12 ENCOUNTER — Ambulatory Visit: Payer: PPO | Admitting: Podiatry

## 2019-05-12 ENCOUNTER — Other Ambulatory Visit: Payer: Self-pay

## 2019-05-12 ENCOUNTER — Ambulatory Visit: Payer: PPO

## 2019-05-12 VITALS — Temp 97.9°F

## 2019-05-12 DIAGNOSIS — S9032XA Contusion of left foot, initial encounter: Secondary | ICD-10-CM

## 2019-05-12 DIAGNOSIS — M79672 Pain in left foot: Secondary | ICD-10-CM

## 2019-05-12 DIAGNOSIS — R600 Localized edema: Secondary | ICD-10-CM | POA: Diagnosis not present

## 2019-05-14 NOTE — Progress Notes (Signed)
Subjective:   Patient ID: Deborah Adams, female   DOB: 68 y.o.   MRN: [id number]   HPI Patient states the foot is feeling quite a bit better with swelling still noted upon a lot of walking or when trying to wear certain shoes but overall improving   ROS      Objective:  Physical Exam  Neurovascular status intact with significant diminishment of discomfort lateral side left foot     Assessment:  Edema still present with improvement but still moderately painful     Plan:  Discussed the importance of continued compression elevation and supportive shoes and patient will be seen back to recheck  X-rays indicate that there is moderate fracture but it is localized and should heal uneventfully

## 2019-05-30 DIAGNOSIS — L719 Rosacea, unspecified: Secondary | ICD-10-CM | POA: Diagnosis not present

## 2019-05-30 DIAGNOSIS — K219 Gastro-esophageal reflux disease without esophagitis: Secondary | ICD-10-CM | POA: Diagnosis not present

## 2019-05-30 DIAGNOSIS — I10 Essential (primary) hypertension: Secondary | ICD-10-CM | POA: Diagnosis not present

## 2019-05-30 DIAGNOSIS — Z23 Encounter for immunization: Secondary | ICD-10-CM | POA: Diagnosis not present

## 2019-05-30 DIAGNOSIS — Z0000 Encounter for general adult medical examination without abnormal findings: Secondary | ICD-10-CM | POA: Diagnosis not present

## 2019-05-30 DIAGNOSIS — E782 Mixed hyperlipidemia: Secondary | ICD-10-CM | POA: Diagnosis not present

## 2019-09-08 DIAGNOSIS — M8580 Other specified disorders of bone density and structure, unspecified site: Secondary | ICD-10-CM | POA: Diagnosis not present

## 2019-09-08 DIAGNOSIS — M19049 Primary osteoarthritis, unspecified hand: Secondary | ICD-10-CM | POA: Diagnosis not present

## 2019-09-08 DIAGNOSIS — M1990 Unspecified osteoarthritis, unspecified site: Secondary | ICD-10-CM | POA: Diagnosis not present

## 2019-09-08 DIAGNOSIS — I10 Essential (primary) hypertension: Secondary | ICD-10-CM | POA: Diagnosis not present

## 2019-09-08 DIAGNOSIS — E782 Mixed hyperlipidemia: Secondary | ICD-10-CM | POA: Diagnosis not present

## 2019-10-22 DIAGNOSIS — I10 Essential (primary) hypertension: Secondary | ICD-10-CM | POA: Diagnosis not present

## 2019-10-22 DIAGNOSIS — E782 Mixed hyperlipidemia: Secondary | ICD-10-CM | POA: Diagnosis not present

## 2019-10-22 DIAGNOSIS — M8580 Other specified disorders of bone density and structure, unspecified site: Secondary | ICD-10-CM | POA: Diagnosis not present

## 2019-10-22 DIAGNOSIS — M1990 Unspecified osteoarthritis, unspecified site: Secondary | ICD-10-CM | POA: Diagnosis not present

## 2019-10-22 DIAGNOSIS — M19049 Primary osteoarthritis, unspecified hand: Secondary | ICD-10-CM | POA: Diagnosis not present

## 2019-12-08 DIAGNOSIS — R21 Rash and other nonspecific skin eruption: Secondary | ICD-10-CM | POA: Diagnosis not present

## 2019-12-08 DIAGNOSIS — I10 Essential (primary) hypertension: Secondary | ICD-10-CM | POA: Diagnosis not present

## 2019-12-14 NOTE — Progress Notes (Signed)
Deborah Ege, MD Reason for referral-dyspnea and hypertension  HPI: 69 year old female for evaluation of dyspnea and hypertension at request of Melinda Crutch, MD. Laboratories March 2021 showed normal TSH, BUN 19, creatinine 1.06, sodium 143 and potassium 4.2.  Patient states that for the past 3 to 4 weeks she has had dyspnea on exertion which is new.  No orthopnea, PND, pedal edema, chest pain or syncope.  She has also had difficulties with elevated blood pressure.  Hydrochlorothiazide was added 9 days ago and her blood pressure remains elevated.  She is also complaining of frequent urination.  Cardiology now asked to evaluate.  Current Outpatient Medications  Medication Sig Dispense Refill  . atorvastatin (LIPITOR) 20 MG tablet atorvastatin 20 mg tablet    . doxycycline (VIBRAMYCIN) 100 MG capsule 1 capsule Orally twice a day for 10 day(s)    . estradiol (ESTRACE) 1 MG tablet estradiol 1 mg tablet    . hydrochlorothiazide (HYDRODIURIL) 25 MG tablet Take 25 mg by mouth daily.    Marland Kitchen lisinopril (PRINIVIL,ZESTRIL) 20 MG tablet Take 1 tablet (20 mg total) by mouth daily. 30 tablet 11  . loratadine (CLARITIN) 10 MG tablet Take 10 mg by mouth daily.    . magnesium gluconate (MAGONATE) 500 MG tablet Take 500 mg by mouth daily.    . mometasone (NASONEX) 50 MCG/ACT nasal spray Place 2 sprays into the nose daily.    Marland Kitchen omeprazole (PRILOSEC) 20 MG capsule TAKE (1) CAPSULE DAILY 30 capsule 0  . triamcinolone cream (KENALOG) 0.1 % 1 application Externally Two times a day for 14 days    . valACYclovir (VALTREX) 1000 MG tablet 1 tablet Orally three times a day for 10 day(s)     No current facility-administered medications for this visit.    Allergies  Allergen Reactions  . Penicillins   . Sulfa Antibiotics      Past Medical History:  Diagnosis Date  . GERD (gastroesophageal reflux disease)   . Hyperlipidemia   . Hypertension     Past Surgical History:  Procedure Laterality Date  .  ABDOMINAL HYSTERECTOMY    . BUNIONECTOMY Right   . WRIST SURGERY Left     Social History   Socioeconomic History  . Marital status: Married    Spouse name: Not on file  . Number of children: Not on file  . Years of education: Not on file  . Highest education level: Not on file  Occupational History  . Not on file  Tobacco Use  . Smoking status: Never Smoker  . Smokeless tobacco: Never Used  Substance and Sexual Activity  . Alcohol use: No  . Drug use: No  . Sexual activity: Not on file  Other Topics Concern  . Not on file  Social History Narrative  . Not on file   Social Determinants of Health   Financial Resource Strain:   . Difficulty of Paying Living Expenses: Not on file  Food Insecurity:   . Worried About Charity fundraiser in the Last Year: Not on file  . Ran Out of Food in the Last Year: Not on file  Transportation Needs:   . Lack of Transportation (Medical): Not on file  . Lack of Transportation (Non-Medical): Not on file  Physical Activity:   . Days of Exercise per Week: Not on file  . Minutes of Exercise per Session: Not on file  Stress:   . Feeling of Stress : Not on file  Social Connections:   .  Frequency of Communication with Friends and Family: Not on file  . Frequency of Social Gatherings with Friends and Family: Not on file  . Attends Religious Services: Not on file  . Active Member of Clubs or Organizations: Not on file  . Attends Archivist Meetings: Not on file  . Marital Status: Not on file  Intimate Partner Violence:   . Fear of Current or Ex-Partner: Not on file  . Emotionally Abused: Not on file  . Physically Abused: Not on file  . Sexually Abused: Not on file    Family History  Problem Relation Age of Onset  . Hyperlipidemia Mother   . Heart disease Father   . Hypertension Brother     ROS: no fevers or chills, productive cough, hemoptysis, dysphasia, odynophagia, melena, hematochezia, dysuria, hematuria, rash, seizure  activity, orthopnea, PND, pedal edema, claudication. Remaining systems are negative.  Physical Exam:   Blood pressure 140/82, pulse 86, height 5' 2.5" (1.588 m), weight 169 lb (76.7 kg).  General:  Well developed/well nourished in NAD Skin warm/dry Patient not depressed No peripheral clubbing Back-normal HEENT-normal/normal eyelids Neck supple/normal carotid upstroke bilaterally; no bruits; no JVD; no thyromegaly chest - CTA/ normal expansion CV - RRR/normal S1 and S2; no murmurs, rubs or gallops;  PMI nondisplaced Abdomen -NT/ND, no HSM, no mass, + bowel sounds, no bruit 2+ femoral pulses, no bruits Ext-no edema, chords, 2+ DP Neuro-grossly nonfocal  ECG -sinus rhythm at a rate of 86, poor R wave progression cannot rule out prior anterior infarct.  Personally reviewed  A/P  1 dyspnea-etiology unclear.  She is not volume overloaded on examination.  I will arrange an echocardiogram to assess LV function.  I will also order a CTA to exclude coronary disease as her father had coronary artery bypass and graft in his 18s.  2 hypertension-blood pressure remains elevated.  She is also describing increased urination.  Decrease hydrochlorothiazide to 12.5 mg daily.  Add amlodipine 5 mg daily.  Follow blood pressure and adjust regimen as needed.  3 hyperlipidemia-continue statin.  Kirk Ruths, MD

## 2019-12-15 ENCOUNTER — Telehealth: Payer: Self-pay | Admitting: Cardiology

## 2019-12-15 NOTE — Telephone Encounter (Signed)
I spoke to the patient who will need her husband to accompany her to the appointment 3/10 with Dr Stanford Breed, because of mobility issues.

## 2019-12-15 NOTE — Telephone Encounter (Signed)
Patient is requesting for her husband to come with her to her appt on Wednesday with Dr. Stanford Breed. She states that her husband is her ride and she also has a hard time walking and will need assistance. She states she still feels weak.

## 2019-12-17 ENCOUNTER — Ambulatory Visit (INDEPENDENT_AMBULATORY_CARE_PROVIDER_SITE_OTHER): Payer: PPO | Admitting: Cardiology

## 2019-12-17 ENCOUNTER — Encounter: Payer: Self-pay | Admitting: Cardiology

## 2019-12-17 ENCOUNTER — Other Ambulatory Visit: Payer: Self-pay

## 2019-12-17 VITALS — BP 140/82 | HR 86 | Ht 62.5 in | Wt 169.0 lb

## 2019-12-17 DIAGNOSIS — R0609 Other forms of dyspnea: Secondary | ICD-10-CM

## 2019-12-17 DIAGNOSIS — E7800 Pure hypercholesterolemia, unspecified: Secondary | ICD-10-CM | POA: Diagnosis not present

## 2019-12-17 DIAGNOSIS — I10 Essential (primary) hypertension: Secondary | ICD-10-CM

## 2019-12-17 DIAGNOSIS — R0600 Dyspnea, unspecified: Secondary | ICD-10-CM | POA: Diagnosis not present

## 2019-12-17 MED ORDER — HYDROCHLOROTHIAZIDE 12.5 MG PO TABS: 12.5000 mg | ORAL_TABLET | Freq: Every day | ORAL | 3 refills | Status: DC

## 2019-12-17 MED ORDER — AMLODIPINE BESYLATE 5 MG PO TABS: 5.0000 mg | ORAL_TABLET | Freq: Every day | ORAL | 3 refills | Status: DC

## 2019-12-17 MED ORDER — METOPROLOL TARTRATE 100 MG PO TABS: ORAL_TABLET | ORAL | 0 refills | Status: DC

## 2019-12-17 NOTE — Patient Instructions (Signed)
Medication Instructions:  DECREASE HCTZ TO 12.5 MG ONCE DAILY= 1/2 OF THE 25 MG TABLET ONCE DAILY  START AMLODIPINE 5 MG ONCE DAILY  *If you need a refill on your cardiac medications before your next appointment, please call your pharmacy*   Lab Work: If you have labs (blood work) drawn today and your tests are completely normal, you will receive your results only by: Marland Kitchen MyChart Message (if you have MyChart) OR . A paper copy in the mail If you have any lab test that is abnormal or we need to change your treatment, we will call you to review the results.   Testing/Procedures: Your physician has requested that you have an echocardiogram. Echocardiography is a painless test that uses sound waves to create images of your heart. It provides your doctor with information about the size and shape of your heart and how well your heart's chambers and valves are working. This procedure takes approximately one hour. There are no restrictions for this procedure.[facility] [facility] [facility]  Your cardiac CT will be scheduled at one of the below locations:   [facility] [address] [place], [place] [address] (336) [place] [address] [place], [place] [address] [phone]  If scheduled at [facility], please arrive at the [facility] main entrance of [facility] 30 minutes prior to test start time. Proceed to the [facility] Radiology Department (first floor) to check-in and test prep.  If scheduled at [facility], please arrive 15 mins early for check-in and test prep.  Please follow these instructions carefully (unless otherwise directed):  Hold all erectile dysfunction medications at least 3 days (72 hrs) prior to test.  On the Night Before the Test: . Be sure to Drink plenty of water. . Do not consume any caffeinated/decaffeinated beverages or chocolate 12 hours prior to  your test. . Do not take any antihistamines 12 hours prior to your test. . If you take Metformin do not take 24 hours prior to test.  On the Day of the Test: . Drink plenty of water. Do not drink any water within one hour of the test. . Do not eat any food 4 hours prior to the test. . You may take your regular medications prior to the test.  . Take metoprolol (Lopressor) 100 MG two hours prior to test. . HOLD Furosemide/Hydrochlorothiazide morning of the test. . FEMALES- please wear underwire-free bra if available       After the Test: . Drink plenty of water. . After receiving IV contrast, you may experience a mild flushed feeling. This is normal. . On occasion, you may experience a mild rash up to 24 hours after the test. This is not dangerous. If this occurs, you can take Benadryl 25 mg and increase your fluid intake. . If you experience trouble breathing, this can be serious. If it is severe call 911 IMMEDIATELY. If it is mild, please call our office. . If you take any of these medications: Glipizide/Metformin, Avandament, Glucavance, please do not take 48 hours after completing test unless otherwise instructed.   Once we have confirmed authorization from your insurance company, we will call you to set up a date and time for your test.   For non-scheduling related questions, please contact the cardiac imaging nurse navigator should you have any questions/concerns: Marchia Bond, RN Navigator Cardiac Imaging [facility] [phone] office  Follow-Up: At [facility] you and your health needs are our priority.  As part of our continuing mission to provide you with exceptional heart care, we have created designated Provider Care Teams.  These Care Teams include your primary Cardiologist (physician) and Advanced Practice Providers (APPs -  Physician Assistants and Nurse Practitioners) who all work together to provide you with the care you need,  when you need it.  We recommend signing up for the patient portal called "MyChart".  Sign up information is provided on this After Visit Summary.  MyChart is used to connect with patients for Virtual Visits (Telemedicine).  Patients are able to view lab/test results, encounter notes, upcoming appointments, etc.  Non-urgent messages can be sent to your provider as well.   To learn more about what you can do with MyChart, go to [email or web address].    Your next appointment:   8-12 week(s)  The format for your next appointment:   In Person  Provider:   Kirk Ruths, MD

## 2019-12-23 DIAGNOSIS — R739 Hyperglycemia, unspecified: Secondary | ICD-10-CM | POA: Diagnosis not present

## 2019-12-23 DIAGNOSIS — R944 Abnormal results of kidney function studies: Secondary | ICD-10-CM | POA: Diagnosis not present

## 2019-12-23 DIAGNOSIS — I10 Essential (primary) hypertension: Secondary | ICD-10-CM | POA: Diagnosis not present

## 2020-01-01 ENCOUNTER — Ambulatory Visit (HOSPITAL_COMMUNITY): Payer: PPO | Attending: Cardiology

## 2020-01-01 ENCOUNTER — Other Ambulatory Visit: Payer: Self-pay

## 2020-01-01 DIAGNOSIS — R0609 Other forms of dyspnea: Secondary | ICD-10-CM

## 2020-01-01 DIAGNOSIS — R0600 Dyspnea, unspecified: Secondary | ICD-10-CM | POA: Diagnosis not present

## 2020-01-05 ENCOUNTER — Other Ambulatory Visit: Payer: Self-pay | Admitting: *Deleted

## 2020-01-05 DIAGNOSIS — R0609 Other forms of dyspnea: Secondary | ICD-10-CM

## 2020-01-15 DIAGNOSIS — R0600 Dyspnea, unspecified: Secondary | ICD-10-CM | POA: Diagnosis not present

## 2020-01-16 ENCOUNTER — Telehealth: Payer: Self-pay | Admitting: *Deleted

## 2020-01-16 LAB — BASIC METABOLIC PANEL
BUN/Creatinine Ratio: 13 (ref 12–28)
BUN: 19 mg/dL (ref 8–27)
CO2: 21 mmol/L (ref 20–29)
Calcium: 9.8 mg/dL (ref 8.7–10.3)
Chloride: 104 mmol/L (ref 96–106)
Creatinine, Ser: 1.44 mg/dL — ABNORMAL HIGH (ref 0.57–1.00)
GFR calc Af Amer: 42 mL/min/{1.73_m2} — ABNORMAL LOW (ref >59)
GFR calc non Af Amer: 37 mL/min/{1.73_m2} — ABNORMAL LOW (ref >59)
Glucose: 102 mg/dL — ABNORMAL HIGH (ref 65–99)
Potassium: 4.3 mmol/L (ref 3.5–5.2)
Sodium: 143 mmol/L (ref 134–144)

## 2020-01-16 NOTE — Telephone Encounter (Signed)
-----   Message from Lelon Perla, MD sent at 01/16/2020 10:19 AM EDT ----- Dc hctz. Repeat bmet 2 weeks Kirk Ruths

## 2020-01-16 NOTE — Telephone Encounter (Signed)
Left message to call back  

## 2020-01-21 ENCOUNTER — Telehealth (HOSPITAL_COMMUNITY): Payer: Self-pay | Admitting: Emergency Medicine

## 2020-01-21 NOTE — Telephone Encounter (Signed)
Reaching out to patient to offer assistance regarding upcoming cardiac imaging study; pt verbalizes understanding of appt date/time, parking situation and where to check in, pre-test NPO status and medications ordered, and verified current allergies; name and call back number provided for further questions should they arise Ahnyla Mendel RN Navigator Cardiac Imaging [facility] [phone] office [phone] cell 

## 2020-01-22 ENCOUNTER — Ambulatory Visit (HOSPITAL_COMMUNITY): Payer: PPO

## 2020-01-22 ENCOUNTER — Ambulatory Visit
Admission: RE | Admit: 2020-01-22 | Discharge: 2020-01-22 | Disposition: A | Discharge status: Home / Self Care | Payer: PPO | Source: Ambulatory Visit | Attending: Cardiology | Admitting: Cardiology

## 2020-01-22 ENCOUNTER — Other Ambulatory Visit: Payer: Self-pay

## 2020-01-22 DIAGNOSIS — R911 Solitary pulmonary nodule: Secondary | ICD-10-CM | POA: Insufficient documentation

## 2020-01-22 DIAGNOSIS — R079 Chest pain, unspecified: Secondary | ICD-10-CM | POA: Insufficient documentation

## 2020-01-22 DIAGNOSIS — K449 Diaphragmatic hernia without obstruction or gangrene: Secondary | ICD-10-CM | POA: Diagnosis not present

## 2020-01-22 DIAGNOSIS — I700 Atherosclerosis of aorta: Secondary | ICD-10-CM | POA: Insufficient documentation

## 2020-01-22 DIAGNOSIS — R0609 Other forms of dyspnea: Secondary | ICD-10-CM

## 2020-01-22 DIAGNOSIS — R0600 Dyspnea, unspecified: Secondary | ICD-10-CM | POA: Diagnosis not present

## 2020-01-22 LAB — POCT I-STAT CREATININE: Creatinine, Ser: 1.1 mg/dL — ABNORMAL HIGH (ref 0.44–1.00)

## 2020-01-22 MED ORDER — NITROGLYCERIN 0.4 MG SL SUBL
0.8000 mg | SUBLINGUAL_TABLET | Freq: Once | SUBLINGUAL | Status: AC
  Administered 2020-01-22: 16:00:00 0.8 mg via SUBLINGUAL

## 2020-01-22 MED ORDER — IOHEXOL 350 MG/ML SOLN
100.0000 mL | Freq: Once | INTRAVENOUS | Status: AC | PRN
  Administered 2020-01-22: 16:00:00 100 mL via INTRAVENOUS

## 2020-01-22 MED ORDER — METOPROLOL TARTRATE 5 MG/5ML IV SOLN
5.0000 mg | Freq: Once | INTRAVENOUS | Status: AC
  Administered 2020-01-22: 16:00:00 5 mg via INTRAVENOUS

## 2020-01-22 MED ORDER — SODIUM CHLORIDE 0.9 % IV BOLUS
300.0000 mL | Freq: Once | INTRAVENOUS | Status: AC
  Administered 2020-01-22: 300 mL via INTRAVENOUS

## 2020-01-22 NOTE — Progress Notes (Signed)
Patient tolerated CT well. Drank water after and gave a liter of water to drink at home. Ambulated to exit steady gait.

## 2020-01-26 ENCOUNTER — Telehealth: Payer: Self-pay | Admitting: *Deleted

## 2020-01-26 DIAGNOSIS — N289 Disorder of kidney and ureter, unspecified: Secondary | ICD-10-CM

## 2020-01-26 NOTE — Telephone Encounter (Signed)
pt aware of results  Lab orders mailed to the pt  

## 2020-01-26 NOTE — Telephone Encounter (Addendum)
Spoke with pt, Aware of dr Jacalyn Lefevre recommendations.   ----- Message from Lelon Perla, MD sent at 01/16/2020 10:19 AM EDT ----- Dc hctz. Repeat bmet 2 weeks Kirk Ruths

## 2020-02-05 DIAGNOSIS — N289 Disorder of kidney and ureter, unspecified: Secondary | ICD-10-CM | POA: Diagnosis not present

## 2020-02-06 ENCOUNTER — Encounter: Payer: Self-pay | Admitting: *Deleted

## 2020-02-06 LAB — BASIC METABOLIC PANEL
BUN/Creatinine Ratio: 12 (ref 12–28)
BUN: 15 mg/dL (ref 8–27)
CO2: 21 mmol/L (ref 20–29)
Calcium: 10 mg/dL (ref 8.7–10.3)
Chloride: 103 mmol/L (ref 96–106)
Creatinine, Ser: 1.22 mg/dL — ABNORMAL HIGH (ref 0.57–1.00)
GFR calc Af Amer: 52 mL/min/{1.73_m2} — ABNORMAL LOW (ref >59)
GFR calc non Af Amer: 45 mL/min/{1.73_m2} — ABNORMAL LOW (ref >59)
Glucose: 113 mg/dL — ABNORMAL HIGH (ref 65–99)
Potassium: 4.5 mmol/L (ref 3.5–5.2)
Sodium: 139 mmol/L (ref 134–144)

## 2020-02-17 ENCOUNTER — Telehealth: Payer: Self-pay | Admitting: Cardiology

## 2020-02-17 DIAGNOSIS — I10 Essential (primary) hypertension: Secondary | ICD-10-CM

## 2020-02-17 MED ORDER — LISINOPRIL 40 MG PO TABS: 40.0000 mg | ORAL_TABLET | Freq: Every day | ORAL | 3 refills | Status: DC

## 2020-02-17 MED ORDER — LISINOPRIL 40 MG PO TABS: 40.0000 mg | ORAL_TABLET | Freq: Every day | ORAL | 3 refills | Status: AC

## 2020-02-17 NOTE — Telephone Encounter (Signed)
Discontinue amlodipine.  Increase lisinopril to 40 mg daily.  Check potassium and renal function in 1 week.  Follow blood pressure. Kirk Ruths

## 2020-02-17 NOTE — Telephone Encounter (Signed)
Returned the call to the patient. She stated that she has been having bilateral foot and ankle swelling for the past two weeks that has progressively been getting worse. It does get somewhat better overnight. She has been elevating her feet as much as possible during the day and watching her sodium intake.  She stated that she has gained around 2 pounds that comes and go. She denies symptoms other than the swelling.   She is wondering if this could be from the Amlodipine.

## 2020-02-17 NOTE — Telephone Encounter (Signed)
Pt c/o swelling: STAT is pt has developed SOB within 24 hours  1) How much weight have you gained and in what time span? She said not really, she normally goes +/-2-3lbs.   2) If swelling, where is the swelling located? Both of her feet and ankles   3) Are you currently taking a fluid pill? No   4) Are you currently SOB? No   5) Do you have a log of your daily weights (if so, list)? No   6) Have you gained 3 pounds in a day or 5 pounds in a week? unsure  7) Have you traveled recently? No

## 2020-02-17 NOTE — Telephone Encounter (Signed)
Spoke with pt, Aware of dr crenshaw's recommendations. New script sent to the pharmacy and Lab orders mailed to the pt  

## 2020-02-23 DIAGNOSIS — I10 Essential (primary) hypertension: Secondary | ICD-10-CM | POA: Diagnosis not present

## 2020-02-23 LAB — BASIC METABOLIC PANEL
BUN/Creatinine Ratio: 23 (ref 12–28)
BUN: 23 mg/dL (ref 8–27)
CO2: 21 mmol/L (ref 20–29)
Calcium: 9.9 mg/dL (ref 8.7–10.3)
Chloride: 101 mmol/L (ref 96–106)
Creatinine, Ser: 1.02 mg/dL — ABNORMAL HIGH (ref 0.57–1.00)
GFR calc Af Amer: 64 mL/min/{1.73_m2} (ref >59)
GFR calc non Af Amer: 55 mL/min/{1.73_m2} — ABNORMAL LOW (ref >59)
Glucose: 97 mg/dL (ref 65–99)
Potassium: 4.8 mmol/L (ref 3.5–5.2)
Sodium: 141 mmol/L (ref 134–144)

## 2020-02-23 NOTE — Progress Notes (Signed)
HPI: FU dyspnea and hypertension. Laboratories March 2021 showed normal TSH, BUN 19, creatinine 1.06, sodium 143 and potassium 4.2.    Echocardiogram March 2020 showed function, mild left ventricular hypertrophy, grade 1 diastolic dysfunction.  CTA April 2021 showed 5 mm right middle lobe nodule and follow-up recommended in 12 months.  Aortic atherosclerosis noted.  Calcium score 0 with no coronary disease. Since last seen the patient has dyspnea with more extreme activities but not with routine activities. It is relieved with rest. It is not associated with chest pain. There is no orthopnea, PND or pedal edema. There is no syncope or palpitations. There is no exertional chest pain.   Current Outpatient Medications  Medication Sig Dispense Refill  . atorvastatin (LIPITOR) 20 MG tablet atorvastatin 20 mg tablet    . calcium carbonate (OS-CAL - DOSED IN MG OF ELEMENTAL CALCIUM) 1250 (500 Ca) MG tablet 1 tablet Orally Once a day    . estradiol (ESTRACE) 1 MG tablet estradiol 1 mg tablet    . lisinopril (ZESTRIL) 40 MG tablet Take 1 tablet (40 mg total) by mouth daily. 90 tablet 3  . loratadine (CLARITIN) 10 MG tablet Take 10 mg by mouth daily.    . Magnesium 500 MG CAPS 1 tablet with a meal Orally one per day    . metroNIDAZOLE (METROCREAM) A999333 % cream 1 application to affected area Externally Twice a day    . mometasone (NASONEX) 50 MCG/ACT nasal spray Place 2 sprays into the nose daily.    Marland Kitchen omeprazole (PRILOSEC) 20 MG capsule TAKE (1) CAPSULE DAILY 30 capsule 0   No current facility-administered medications for this visit.     Past Medical History:  Diagnosis Date  . GERD (gastroesophageal reflux disease)   . Hyperlipidemia   . Hypertension     Past Surgical History:  Procedure Laterality Date  . ABDOMINAL HYSTERECTOMY    . BUNIONECTOMY Right   . WRIST SURGERY Left     Social History   Socioeconomic History  . Marital status: Married    Spouse name: Not on file  .  Number of children: Not on file  . Years of education: Not on file  . Highest education level: Not on file  Occupational History  . Not on file  Tobacco Use  . Smoking status: Never Smoker  . Smokeless tobacco: Never Used  Substance and Sexual Activity  . Alcohol use: No  . Drug use: No  . Sexual activity: Not on file  Other Topics Concern  . Not on file  Social History Narrative  . Not on file   Social Determinants of Health   Financial Resource Strain:   . Difficulty of Paying Living Expenses:   Food Insecurity:   . Worried About Charity fundraiser in the Last Year:   . Arboriculturist in the Last Year:   Transportation Needs:   . Film/video editor (Medical):   Marland Kitchen Lack of Transportation (Non-Medical):   Physical Activity:   . Days of Exercise per Week:   . Minutes of Exercise per Session:   Stress:   . Feeling of Stress :   Social Connections:   . Frequency of Communication with Friends and Family:   . Frequency of Social Gatherings with Friends and Family:   . Attends Religious Services:   . Active Member of Clubs or Organizations:   . Attends Archivist Meetings:   Marland Kitchen Marital Status:   Intimate  Partner Violence:   . Fear of Current or Ex-Partner:   . Emotionally Abused:   Marland Kitchen Physically Abused:   . Sexually Abused:     Family History  Problem Relation Age of Onset  . Hyperlipidemia Mother   . Heart disease Father   . Hypertension Brother     ROS: no fevers or chills, productive cough, hemoptysis, dysphasia, odynophagia, melena, hematochezia, dysuria, hematuria, rash, seizure activity, orthopnea, PND, pedal edema, claudication. Remaining systems are negative.  Physical Exam: Well-developed well-nourished in no acute distress.  Skin is warm and dry.  HEENT is normal.  Neck is supple.  Chest is clear to auscultation with normal expansion.  Cardiovascular exam is regular rate and rhythm.  Abdominal exam nontender or distended. No masses  palpated. Extremities show no edema. neuro grossly intact  A/P  1 dyspnea-etiology unclear.  Echocardiogram shows normal LV function and cardiac CTA shows no coronary disease.  We will not pursue further cardiac evaluation.  2 lung nodule-she will need follow-up CT April 2022.  3 hypertension-patient's blood pressure is controlled.  Continue present medications and follow.  4 hyperlipidemia-advance diet.  Kirk Ruths, MD

## 2020-02-24 ENCOUNTER — Encounter: Payer: Self-pay | Admitting: *Deleted

## 2020-03-01 ENCOUNTER — Ambulatory Visit: Payer: PPO | Admitting: Cardiology

## 2020-03-01 ENCOUNTER — Other Ambulatory Visit: Payer: Self-pay

## 2020-03-01 ENCOUNTER — Encounter: Payer: Self-pay | Admitting: Cardiology

## 2020-03-01 VITALS — BP 124/64 | HR 85 | Ht 62.0 in | Wt 170.2 lb

## 2020-03-01 DIAGNOSIS — I10 Essential (primary) hypertension: Secondary | ICD-10-CM | POA: Diagnosis not present

## 2020-03-01 DIAGNOSIS — E7800 Pure hypercholesterolemia, unspecified: Secondary | ICD-10-CM | POA: Diagnosis not present

## 2020-03-01 DIAGNOSIS — R0609 Other forms of dyspnea: Secondary | ICD-10-CM

## 2020-03-01 DIAGNOSIS — R0600 Dyspnea, unspecified: Secondary | ICD-10-CM

## 2020-03-01 DIAGNOSIS — R911 Solitary pulmonary nodule: Secondary | ICD-10-CM

## 2020-03-01 NOTE — Patient Instructions (Signed)

## 2020-03-11 DIAGNOSIS — E782 Mixed hyperlipidemia: Secondary | ICD-10-CM | POA: Diagnosis not present

## 2020-03-11 DIAGNOSIS — I10 Essential (primary) hypertension: Secondary | ICD-10-CM | POA: Diagnosis not present

## 2020-03-11 DIAGNOSIS — R7303 Prediabetes: Secondary | ICD-10-CM | POA: Diagnosis not present

## 2020-03-18 DIAGNOSIS — K219 Gastro-esophageal reflux disease without esophagitis: Secondary | ICD-10-CM | POA: Diagnosis not present

## 2020-03-18 DIAGNOSIS — N1831 Chronic kidney disease, stage 3a: Secondary | ICD-10-CM | POA: Diagnosis not present

## 2020-03-18 DIAGNOSIS — E782 Mixed hyperlipidemia: Secondary | ICD-10-CM | POA: Diagnosis not present

## 2020-03-18 DIAGNOSIS — M1990 Unspecified osteoarthritis, unspecified site: Secondary | ICD-10-CM | POA: Diagnosis not present

## 2020-03-18 DIAGNOSIS — Z0000 Encounter for general adult medical examination without abnormal findings: Secondary | ICD-10-CM | POA: Diagnosis not present

## 2020-03-18 DIAGNOSIS — I10 Essential (primary) hypertension: Secondary | ICD-10-CM | POA: Diagnosis not present

## 2020-03-18 DIAGNOSIS — R7303 Prediabetes: Secondary | ICD-10-CM | POA: Diagnosis not present

## 2020-03-18 DIAGNOSIS — E669 Obesity, unspecified: Secondary | ICD-10-CM | POA: Diagnosis not present

## 2020-05-13 DIAGNOSIS — R21 Rash and other nonspecific skin eruption: Secondary | ICD-10-CM | POA: Diagnosis not present

## 2020-05-13 DIAGNOSIS — B880 Other acariasis: Secondary | ICD-10-CM | POA: Diagnosis not present

## 2020-06-08 DIAGNOSIS — Z23 Encounter for immunization: Secondary | ICD-10-CM | POA: Diagnosis not present

## 2020-06-08 DIAGNOSIS — R7303 Prediabetes: Secondary | ICD-10-CM | POA: Diagnosis not present

## 2020-06-08 DIAGNOSIS — J309 Allergic rhinitis, unspecified: Secondary | ICD-10-CM | POA: Diagnosis not present

## 2020-06-08 DIAGNOSIS — M25512 Pain in left shoulder: Secondary | ICD-10-CM | POA: Diagnosis not present

## 2020-06-08 DIAGNOSIS — L719 Rosacea, unspecified: Secondary | ICD-10-CM | POA: Diagnosis not present

## 2020-06-08 DIAGNOSIS — I10 Essential (primary) hypertension: Secondary | ICD-10-CM | POA: Diagnosis not present

## 2020-07-19 DIAGNOSIS — M19049 Primary osteoarthritis, unspecified hand: Secondary | ICD-10-CM | POA: Diagnosis not present

## 2020-07-19 DIAGNOSIS — M1990 Unspecified osteoarthritis, unspecified site: Secondary | ICD-10-CM | POA: Diagnosis not present

## 2020-07-19 DIAGNOSIS — M8580 Other specified disorders of bone density and structure, unspecified site: Secondary | ICD-10-CM | POA: Diagnosis not present

## 2020-07-19 DIAGNOSIS — N1831 Chronic kidney disease, stage 3a: Secondary | ICD-10-CM | POA: Diagnosis not present

## 2020-07-19 DIAGNOSIS — I10 Essential (primary) hypertension: Secondary | ICD-10-CM | POA: Diagnosis not present

## 2020-07-19 DIAGNOSIS — E782 Mixed hyperlipidemia: Secondary | ICD-10-CM | POA: Diagnosis not present

## 2020-12-20 ENCOUNTER — Ambulatory Visit: Payer: PPO | Admitting: Podiatry

## 2020-12-20 ENCOUNTER — Other Ambulatory Visit: Payer: Self-pay

## 2020-12-20 ENCOUNTER — Ambulatory Visit (INDEPENDENT_AMBULATORY_CARE_PROVIDER_SITE_OTHER): Payer: PPO

## 2020-12-20 DIAGNOSIS — M722 Plantar fascial fibromatosis: Secondary | ICD-10-CM | POA: Diagnosis not present

## 2020-12-20 DIAGNOSIS — M79672 Pain in left foot: Secondary | ICD-10-CM

## 2020-12-20 MED ORDER — DICLOFENAC SODIUM 75 MG PO TBEC: 75.0000 mg | DELAYED_RELEASE_TABLET | Freq: Two times a day (BID) | ORAL | 2 refills | Status: DC

## 2020-12-20 NOTE — Patient Instructions (Signed)

## 2020-12-20 NOTE — Progress Notes (Signed)
Subjective:   Patient ID: Deborah Adams, female   DOB: 70 y.o.   MRN: [id number]   HPI Patient presents stating she is developed a lot of pain in her left heel that is been going on around 3 months patient states it stabs and its been getting gradually worse and hard to walk   ROS      Objective:  Physical Exam  Neurovascular status intact with exquisite discomfort plantar aspect left heel insertional point tendon calcaneus inflammation fluid around the medial band     Assessment:  Acute plantar fasciitis left inflammation fluid buildup     Plan:  H&P reviewed condition sterile prep injected the fascia 3 mg Kenalog 5 mg Xylocaine applied fascial brace with instructions on usage to wear support shoes and reappoint to recheck  X-rays indicate small spur no indication stress fracture arthritis

## 2021-01-03 ENCOUNTER — Ambulatory Visit: Payer: PPO | Admitting: Podiatry

## 2021-01-03 ENCOUNTER — Other Ambulatory Visit: Payer: Self-pay

## 2021-01-03 DIAGNOSIS — M722 Plantar fascial fibromatosis: Secondary | ICD-10-CM | POA: Diagnosis not present

## 2021-01-03 MED ORDER — TRIAMCINOLONE ACETONIDE 10 MG/ML IJ SUSP
10.0000 mg | Freq: Once | INTRAMUSCULAR | Status: AC
  Administered 2021-01-03: 10 mg

## 2021-01-05 NOTE — Progress Notes (Signed)
Subjective:   Patient ID: Deborah Adams, female   DOB: 70 y.o.   MRN: [id number]   HPI Patient states she is improved but still having discomfort that is more on the outside bottom of the heel than previous and states the inside is quite a bit better   ROS      Objective:  Physical Exam  Neurovascular status intact with medial heel pain quite improved with discomfort in the central lateral band of the fascia which may be compensatory with moderate inflammation and fluid buildup     Assessment:  Improvement medial band fasciitis with lateral band fasciitis noted     Plan:  Continue with shoe gear modification stretching exercises and on the lateral side sterile prep injected the fascia 3 mg dexamethasone Kenalog 5 mg Xylocaine and reappoint to recheck

## 2021-01-06 ENCOUNTER — Telehealth: Payer: Self-pay | Admitting: *Deleted

## 2021-01-06 DIAGNOSIS — R911 Solitary pulmonary nodule: Secondary | ICD-10-CM

## 2021-01-06 NOTE — Telephone Encounter (Signed)
Left message for pt, order is being placed for the CT scan wo to follow up on lung nodule seen on CTA last year. She is to call with questions.

## 2021-01-19 ENCOUNTER — Ambulatory Visit
Admission: RE | Admit: 2021-01-19 | Discharge: 2021-01-19 | Disposition: A | Discharge status: Home / Self Care | Payer: PPO | Source: Ambulatory Visit | Attending: Cardiology | Admitting: Cardiology

## 2021-01-19 ENCOUNTER — Other Ambulatory Visit: Payer: Self-pay

## 2021-01-19 DIAGNOSIS — R918 Other nonspecific abnormal finding of lung field: Secondary | ICD-10-CM | POA: Diagnosis not present

## 2021-01-19 DIAGNOSIS — R911 Solitary pulmonary nodule: Secondary | ICD-10-CM

## 2021-02-21 ENCOUNTER — Other Ambulatory Visit: Payer: Self-pay | Admitting: Podiatry

## 2021-02-21 NOTE — Telephone Encounter (Signed)
Please advise 

## 2021-03-21 DIAGNOSIS — I10 Essential (primary) hypertension: Secondary | ICD-10-CM | POA: Diagnosis not present

## 2021-03-21 DIAGNOSIS — R7303 Prediabetes: Secondary | ICD-10-CM | POA: Diagnosis not present

## 2021-03-21 DIAGNOSIS — E782 Mixed hyperlipidemia: Secondary | ICD-10-CM | POA: Diagnosis not present

## 2021-03-21 DIAGNOSIS — K219 Gastro-esophageal reflux disease without esophagitis: Secondary | ICD-10-CM | POA: Diagnosis not present

## 2021-03-21 DIAGNOSIS — N1831 Chronic kidney disease, stage 3a: Secondary | ICD-10-CM | POA: Diagnosis not present

## 2021-03-21 DIAGNOSIS — Z0000 Encounter for general adult medical examination without abnormal findings: Secondary | ICD-10-CM | POA: Diagnosis not present

## 2021-03-24 DIAGNOSIS — N952 Postmenopausal atrophic vaginitis: Secondary | ICD-10-CM | POA: Diagnosis not present

## 2021-03-24 DIAGNOSIS — M8580 Other specified disorders of bone density and structure, unspecified site: Secondary | ICD-10-CM | POA: Diagnosis not present

## 2021-03-24 DIAGNOSIS — Z0000 Encounter for general adult medical examination without abnormal findings: Secondary | ICD-10-CM | POA: Diagnosis not present

## 2021-03-24 DIAGNOSIS — N1831 Chronic kidney disease, stage 3a: Secondary | ICD-10-CM | POA: Diagnosis not present

## 2021-03-24 DIAGNOSIS — E782 Mixed hyperlipidemia: Secondary | ICD-10-CM | POA: Diagnosis not present

## 2021-03-24 DIAGNOSIS — F411 Generalized anxiety disorder: Secondary | ICD-10-CM | POA: Diagnosis not present

## 2021-03-24 DIAGNOSIS — E669 Obesity, unspecified: Secondary | ICD-10-CM | POA: Diagnosis not present

## 2021-03-24 DIAGNOSIS — I10 Essential (primary) hypertension: Secondary | ICD-10-CM | POA: Diagnosis not present

## 2021-03-24 DIAGNOSIS — L719 Rosacea, unspecified: Secondary | ICD-10-CM | POA: Diagnosis not present

## 2021-03-24 DIAGNOSIS — J309 Allergic rhinitis, unspecified: Secondary | ICD-10-CM | POA: Diagnosis not present

## 2021-03-24 DIAGNOSIS — K219 Gastro-esophageal reflux disease without esophagitis: Secondary | ICD-10-CM | POA: Diagnosis not present

## 2021-03-24 DIAGNOSIS — R7303 Prediabetes: Secondary | ICD-10-CM | POA: Diagnosis not present

## 2021-03-31 DIAGNOSIS — N1831 Chronic kidney disease, stage 3a: Secondary | ICD-10-CM | POA: Diagnosis not present

## 2021-03-31 DIAGNOSIS — E782 Mixed hyperlipidemia: Secondary | ICD-10-CM | POA: Diagnosis not present

## 2021-03-31 DIAGNOSIS — I10 Essential (primary) hypertension: Secondary | ICD-10-CM | POA: Diagnosis not present

## 2021-03-31 DIAGNOSIS — M8580 Other specified disorders of bone density and structure, unspecified site: Secondary | ICD-10-CM | POA: Diagnosis not present

## 2021-03-31 DIAGNOSIS — K219 Gastro-esophageal reflux disease without esophagitis: Secondary | ICD-10-CM | POA: Diagnosis not present

## 2021-03-31 DIAGNOSIS — M1990 Unspecified osteoarthritis, unspecified site: Secondary | ICD-10-CM | POA: Diagnosis not present

## 2021-06-02 DIAGNOSIS — K219 Gastro-esophageal reflux disease without esophagitis: Secondary | ICD-10-CM | POA: Diagnosis not present

## 2021-06-02 DIAGNOSIS — I10 Essential (primary) hypertension: Secondary | ICD-10-CM | POA: Diagnosis not present

## 2021-06-02 DIAGNOSIS — M8580 Other specified disorders of bone density and structure, unspecified site: Secondary | ICD-10-CM | POA: Diagnosis not present

## 2021-06-02 DIAGNOSIS — M1990 Unspecified osteoarthritis, unspecified site: Secondary | ICD-10-CM | POA: Diagnosis not present

## 2021-06-02 DIAGNOSIS — M19049 Primary osteoarthritis, unspecified hand: Secondary | ICD-10-CM | POA: Diagnosis not present

## 2021-06-02 DIAGNOSIS — N1831 Chronic kidney disease, stage 3a: Secondary | ICD-10-CM | POA: Diagnosis not present

## 2021-06-02 DIAGNOSIS — E782 Mixed hyperlipidemia: Secondary | ICD-10-CM | POA: Diagnosis not present

## 2021-08-01 DIAGNOSIS — M8580 Other specified disorders of bone density and structure, unspecified site: Secondary | ICD-10-CM | POA: Diagnosis not present

## 2021-08-01 DIAGNOSIS — I10 Essential (primary) hypertension: Secondary | ICD-10-CM | POA: Diagnosis not present

## 2021-08-01 DIAGNOSIS — M1990 Unspecified osteoarthritis, unspecified site: Secondary | ICD-10-CM | POA: Diagnosis not present

## 2021-08-01 DIAGNOSIS — K219 Gastro-esophageal reflux disease without esophagitis: Secondary | ICD-10-CM | POA: Diagnosis not present

## 2021-08-01 DIAGNOSIS — N1831 Chronic kidney disease, stage 3a: Secondary | ICD-10-CM | POA: Diagnosis not present

## 2021-08-01 DIAGNOSIS — M19049 Primary osteoarthritis, unspecified hand: Secondary | ICD-10-CM | POA: Diagnosis not present

## 2021-08-01 DIAGNOSIS — E782 Mixed hyperlipidemia: Secondary | ICD-10-CM | POA: Diagnosis not present

## 2021-10-01 DIAGNOSIS — Z20828 Contact with and (suspected) exposure to other viral communicable diseases: Secondary | ICD-10-CM | POA: Diagnosis not present

## 2021-10-01 DIAGNOSIS — U071 COVID-19: Secondary | ICD-10-CM | POA: Diagnosis not present

## 2021-10-01 DIAGNOSIS — B349 Viral infection, unspecified: Secondary | ICD-10-CM | POA: Diagnosis not present

## 2021-10-08 ENCOUNTER — Ambulatory Visit (INDEPENDENT_AMBULATORY_CARE_PROVIDER_SITE_OTHER): Payer: PPO

## 2021-10-08 ENCOUNTER — Encounter (HOSPITAL_COMMUNITY): Payer: Self-pay | Admitting: Emergency Medicine

## 2021-10-08 ENCOUNTER — Ambulatory Visit (HOSPITAL_COMMUNITY)
Admission: EM | Admit: 2021-10-08 | Discharge: 2021-10-08 | Disposition: A | Discharge status: Home / Self Care | Payer: PPO

## 2021-10-08 ENCOUNTER — Other Ambulatory Visit: Payer: Self-pay

## 2021-10-08 DIAGNOSIS — U071 COVID-19: Secondary | ICD-10-CM | POA: Diagnosis not present

## 2021-10-08 LAB — POC INFLUENZA A AND B ANTIGEN (URGENT CARE ONLY)
INFLUENZA A ANTIGEN, POC: NEGATIVE
INFLUENZA B ANTIGEN, POC: NEGATIVE

## 2021-10-08 MED ORDER — PREDNISONE 20 MG PO TABS: 40.0000 mg | ORAL_TABLET | Freq: Every day | ORAL | 0 refills | Status: AC

## 2021-10-08 MED ORDER — ONDANSETRON 8 MG PO TBDP: 8.0000 mg | ORAL_TABLET | Freq: Three times a day (TID) | ORAL | 0 refills | Status: AC | PRN

## 2021-10-08 MED ORDER — PROMETHAZINE-DM 6.25-15 MG/5ML PO SYRP: 2.5000 mL | ORAL_SOLUTION | Freq: Four times a day (QID) | ORAL | 0 refills | Status: AC | PRN

## 2021-10-08 MED ORDER — ALBUTEROL SULFATE HFA 108 (90 BASE) MCG/ACT IN AERS: 1.0000 | INHALATION_SPRAY | Freq: Four times a day (QID) | RESPIRATORY_TRACT | 0 refills | Status: AC | PRN

## 2021-10-08 NOTE — Discharge Instructions (Addendum)
-  You have covid-19. This often gets worse again after completing the antiviral Paxlovid. -If symptoms get even worse go to the ED. This includes shortness of breath, chest pain, worsening fevers/chills, etc. -Prednisone, 2 pills taken at the same time for 5 days in a row.  Try taking this earlier in the day as it can give you energy. Avoid NSAIDs like ibuprofen and alleve while taking this medication as they can increase your risk of stomach upset and even GI bleeding when in combination with a steroid. You can continue tylenol (acetaminophen) up to 1000mg  3x daily. -Promethazine DM cough syrup for congestion/cough. This could make you drowsy, so take at night before bed. -Take the Zofran (ondansetron) up to 3 times daily for nausea and vomiting. Dissolve one pill under your tongue or between your teeth and your cheek. -Albuterol inhaler as needed for cough, wheezing, shortness of breath, 1 to 2 puffs every 6 hours as needed. -Continue tylenol for fever reduction

## 2021-10-08 NOTE — ED Provider Notes (Signed)
[facility]    CSN: [id number] Arrival date & time: 10/08/21  1112      History   Chief Complaint Chief Complaint  Patient presents with   Diarrhea    HPI Deborah Adams is a 70 y.o. female presenting with cough, vomiting, diarrhea, fevers/chills. Diagnosed with covid 12/24 at [facility], paxlovid administered, last dose 1 day ago. Symptoms slowly improved but then got worse again 2 days ago. Fevers and chills, diarrhea, one episode of vomiting today.  Cough is nonproductive. Lots of nasal congestion. bilateral ear pain L>R without hearing changes dizziness tinnitus. Hasn't monitored temp at home. Has been taking tylenol, last dose 16 hours ago. Denies SOB. Denies history pulm ds- asthma, COPD. Denies CP, dizziness, weakness. Tolerating fluids.   HPI  Past Medical History:  Diagnosis Date   GERD (gastroesophageal reflux disease)    Hyperlipidemia    Hypertension     Patient Active Problem List   Diagnosis Date Noted   Thoracic back pain 12/25/2018   HTN (hypertension) 01/12/2011   Hyperlipemia 01/12/2011   GERD (gastroesophageal reflux disease) 01/12/2011   Chronic allergic rhinitis 01/12/2011   Esophageal stricture 01/12/2011   Cystocele 01/12/2011   Stress incontinence, female 01/12/2011    Past Surgical History:  Procedure Laterality Date   ABDOMINAL HYSTERECTOMY     BUNIONECTOMY Right    WRIST SURGERY Left     OB History   No obstetric history on file.      Home Medications    Prior to Admission medications   Medication Sig Start Date End Date Taking? Authorizing Provider  albuterol (VENTOLIN HFA) 108 (90 Base) MCG/ACT inhaler Inhale 1-2 puffs into the lungs every 6 (six) hours as needed for wheezing or shortness of breath. 10/08/21  Yes Hazel Sams, PA-C  ondansetron (ZOFRAN-ODT) 8 MG disintegrating tablet Take 1 tablet (8 mg total) by mouth every 8 (eight) hours as needed for nausea or vomiting. 10/08/21  Yes Hazel Sams, PA-C   predniSONE (DELTASONE) 20 MG tablet Take 2 tablets (40 mg total) by mouth daily for 5 days. Take with breakfast or lunch. Avoid NSAIDs (ibuprofen, etc) while taking this medication. 10/08/21 10/13/21 Yes Hazel Sams, PA-C  promethazine-dextromethorphan (PROMETHAZINE-DM) 6.25-15 MG/5ML syrup Take 2.5 mLs by mouth 4 (four) times daily as needed for cough. 10/08/21  Yes Hazel Sams, PA-C  atorvastatin (LIPITOR) 20 MG tablet atorvastatin 20 mg tablet    [provider]  calcium carbonate (OS-CAL - DOSED IN MG OF ELEMENTAL CALCIUM) 1250 (500 Ca) MG tablet 1 tablet Orally Once a day    [provider]  diclofenac (VOLTAREN) 75 MG EC tablet TAKE 1 TABLET BY MOUTH TWICE A DAY 02/22/21   Wallene Huh, DPM  escitalopram (LEXAPRO) 10 MG tablet Take 10 mg by mouth daily. 08/19/21   [provider]  estradiol (ESTRACE) 1 MG tablet estradiol 1 mg tablet    [provider]  lisinopril (ZESTRIL) 40 MG tablet Take 1 tablet (40 mg total) by mouth daily. 02/17/20   Lelon Perla, MD  loratadine (CLARITIN) 10 MG tablet Take 10 mg by mouth daily.    [provider]  Magnesium 500 MG CAPS 1 tablet with a meal Orally one per day    [provider]  metroNIDAZOLE (METROCREAM) 8.46 % cream 1 application to affected area Externally Twice a day    [provider]  mometasone (NASONEX) 50 MCG/ACT nasal spray Place 2 sprays into the nose  daily.    [provider]  omeprazole (PRILOSEC) 20 MG capsule TAKE (1) CAPSULE DAILY 06/12/14   Lysbeth Penner, FNP    Family History Family History  Problem Relation Age of Onset   Hyperlipidemia Mother    Heart disease Father    Hypertension Brother     Social History Social History   Tobacco Use   Smoking status: Never   Smokeless tobacco: Never  Vaping Use   Vaping Use: Never used  Substance Use Topics   Alcohol use: No   Drug use: No     Allergies   Penicillins and Sulfa  antibiotics   Review of Systems Review of Systems  Constitutional:  Positive for chills and fever. Negative for appetite change.  HENT:  Positive for congestion. Negative for ear pain, rhinorrhea, sinus pressure, sinus pain and sore throat.   Eyes:  Negative for redness and visual disturbance.  Respiratory:  Positive for cough. Negative for chest tightness, shortness of breath and wheezing.   Cardiovascular:  Negative for chest pain and palpitations.  Gastrointestinal:  Positive for diarrhea, nausea and vomiting. Negative for abdominal pain and constipation.  Genitourinary:  Negative for dysuria, frequency and urgency.  Musculoskeletal:  Positive for myalgias.  Neurological:  Negative for dizziness, weakness and headaches.  Psychiatric/Behavioral:  Negative for confusion.   All other systems reviewed and are negative.   Physical Exam Triage Vital Signs ED Triage Vitals  Enc Vitals Group     BP 10/08/21 1146 114/75     Pulse Rate 10/08/21 1146 88     Resp 10/08/21 1146 20     Temp 10/08/21 1146 (!) 100.9 F (38.3 C)     Temp Source 10/08/21 1146 Oral     SpO2 10/08/21 1146 97 %     Weight --      Height --      Head Circumference --      Peak Flow --      Pain Score 10/08/21 1143 7     Pain Loc --      Pain Edu? --      Excl. in [place]? --    No data found.  Updated Vital Signs BP 114/75 (BP Location: Right Arm)    Pulse 88    Temp (!) 100.9 F (38.3 C) (Oral)    Resp 20    SpO2 97%   Visual Acuity Right Eye Distance:   Left Eye Distance:   Bilateral Distance:    Right Eye Near:   Left Eye Near:    Bilateral Near:     Physical Exam Vitals reviewed.  Constitutional:      General: She is not in acute distress.    Appearance: Normal appearance. She is ill-appearing.  HENT:     Head: Normocephalic and atraumatic.     Right Ear: Tympanic membrane, ear canal and external ear normal. No tenderness. No middle ear effusion. There is no impacted cerumen. Tympanic membrane  is not perforated, erythematous, retracted or bulging.     Left Ear: Tympanic membrane, ear canal and external ear normal. No tenderness.  No middle ear effusion. There is no impacted cerumen. Tympanic membrane is not perforated, erythematous, retracted or bulging.     Nose: Nose normal. No congestion.     Mouth/Throat:     Mouth: Mucous membranes are moist.     Pharynx: Uvula midline. No oropharyngeal exudate or posterior oropharyngeal erythema.  Eyes:     Extraocular Movements: Extraocular movements intact.  Pupils: Pupils are equal, round, and reactive to light.  Cardiovascular:     Rate and Rhythm: Normal rate and regular rhythm.     Heart sounds: Normal heart sounds.  Pulmonary:     Effort: Pulmonary effort is normal.     Breath sounds: Normal breath sounds. No decreased breath sounds, wheezing, rhonchi or rales.     Comments: Freq cough with expiration  Abdominal:     Palpations: Abdomen is soft.     Tenderness: There is no abdominal tenderness. There is no guarding or rebound.  Lymphadenopathy:     Cervical: No cervical adenopathy.     Right cervical: No superficial cervical adenopathy.    Left cervical: No superficial cervical adenopathy.  Neurological:     General: No focal deficit present.     Mental Status: She is alert and oriented to person, place, and time.  Psychiatric:        Mood and Affect: Mood normal.        Behavior: Behavior normal.        Thought Content: Thought content normal.        Judgment: Judgment normal.     UC Treatments / Results  Labs (all labs ordered are listed, but only abnormal results are displayed) Labs Reviewed  POC INFLUENZA A AND B ANTIGEN (URGENT CARE ONLY)    EKG   Radiology DG Chest 2 View  Result Date: 10/08/2021 CLINICAL DATA:  70 year old female with history of fever. COVID positive patient. EXAM: CHEST - 2 VIEW COMPARISON:  No priors. FINDINGS: Lung volumes are normal. No consolidative airspace disease. No pleural  effusions. No pneumothorax. No pulmonary nodule or mass noted. Pulmonary vasculature and the cardiomediastinal silhouette are within normal limits. Atherosclerosis in the thoracic aorta. IMPRESSION: 1.  No radiographic evidence of acute cardiopulmonary disease. 2. Aortic atherosclerosis. Electronically Signed   By: Vinnie Langton M.D.   On: 10/08/2021 12:30    Procedures Procedures (including critical care time)  Medications Ordered in UC Medications - No data to display  Initial Impression / Assessment and Plan / UC Course  I have reviewed the triage vital signs and the nursing notes.  Pertinent labs & imaging results that were available during my care of the patient were reviewed by me and considered in my medical decision making (see chart for details).     This patient is a very pleasant 70 y.o. year old female presenting with covid-19 x7 days. Completed paxlovid 2 days ago, upon which symptoms got worse again. Suspect rebound from Paxlovid.  CXR - 1.  No radiographic evidence of acute cardiopulmonary disease.  Rapid influenza negative.   Prednisone, albuterol, zofran ODT, low-dose promethazine DM.   STRICT ED return precautions discussed. Patient verbalizes understanding and agreement.   Coding Level 4 for acute illness with systemic symptoms, and prescription drug management   Final Clinical Impressions(s) / UC Diagnoses   Final diagnoses:  COVID-19     Discharge Instructions      -You have covid-19. This often gets worse again after completing the antiviral Paxlovid. -If symptoms get even worse go to the ED. This includes shortness of breath, chest pain, worsening fevers/chills, etc. -Prednisone, 2 pills taken at the same time for 5 days in a row.  Try taking this earlier in the day as it can give you energy. Avoid NSAIDs like ibuprofen and alleve while taking this medication as they can increase your risk of stomach upset and even GI bleeding when in combination with  a steroid. You can continue tylenol (acetaminophen) up to 1000mg  3x daily. -Promethazine DM cough syrup for congestion/cough. This could make you drowsy, so take at night before bed. -Take the Zofran (ondansetron) up to 3 times daily for nausea and vomiting. Dissolve one pill under your tongue or between your teeth and your cheek. -Albuterol inhaler as needed for cough, wheezing, shortness of breath, 1 to 2 puffs every 6 hours as needed. -Continue tylenol for fever reduction     ED Prescriptions     Medication Sig Dispense Auth. Provider   predniSONE (DELTASONE) 20 MG tablet Take 2 tablets (40 mg total) by mouth daily for 5 days. Take with breakfast or lunch. Avoid NSAIDs (ibuprofen, etc) while taking this medication. 10 tablet Hazel Sams, PA-C   promethazine-dextromethorphan (PROMETHAZINE-DM) 6.25-15 MG/5ML syrup Take 2.5 mLs by mouth 4 (four) times daily as needed for cough. 50 mL Hazel Sams, PA-C   albuterol (VENTOLIN HFA) 108 (90 Base) MCG/ACT inhaler Inhale 1-2 puffs into the lungs every 6 (six) hours as needed for wheezing or shortness of breath. 1 each Hazel Sams, PA-C   ondansetron (ZOFRAN-ODT) 8 MG disintegrating tablet Take 1 tablet (8 mg total) by mouth every 8 (eight) hours as needed for nausea or vomiting. 20 tablet Hazel Sams, PA-C      PDMP not reviewed this encounter.   Hazel Sams, PA-C 10/08/21 1258

## 2021-10-08 NOTE — ED Triage Notes (Signed)
09/28/2021 tested positive for covid, took antiviral.   10/06/2021 started vomiting and diarrhea, weakness, and chills

## 2021-11-02 IMAGING — CT CT CHEST W/O CM
1 series · 15 of 34 positions shown, 19 images · non-contrast
Comparison: Cardiac CT dated 01/22/2020

CLINICAL DATA: Follow-up pulmonary nodule

EXAM:
CT CHEST WITHOUT CONTRAST
TECHNIQUE: Multidetector CT imaging of the chest was performed following the
standard protocol without IV contrast.

[Series 2: chest w/(date) · axial · 0.70mm/px · z∈[-325,-67]mm · 15 of 153 slices shown, 19 images]
[im 12/153  mediastinal]
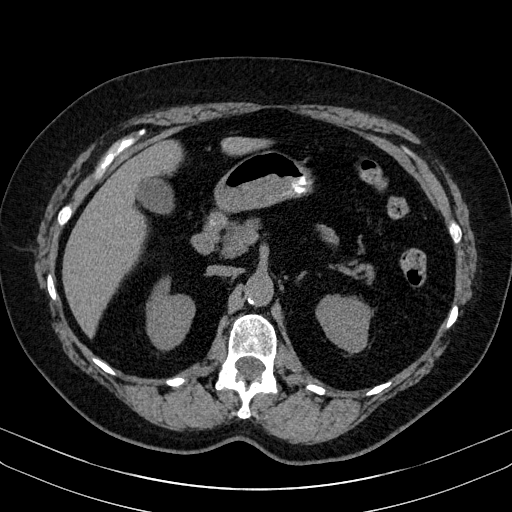
[im 12/153  lung]
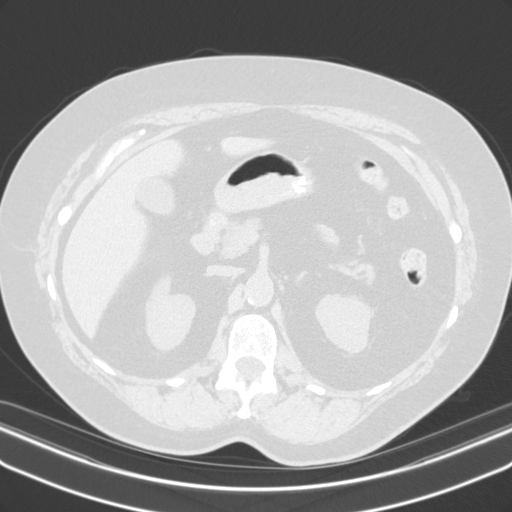
[im 23/153  lung]
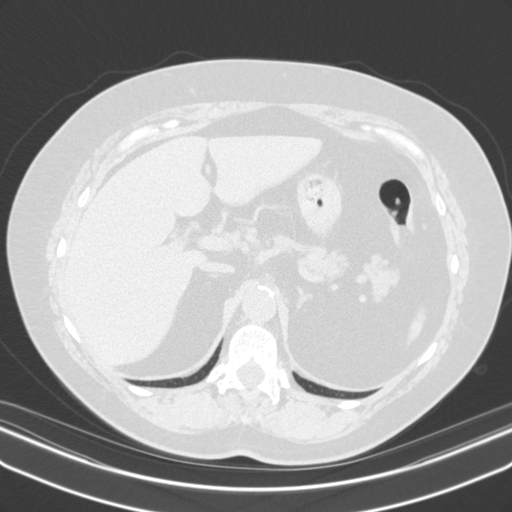
[im 31/153  lung]
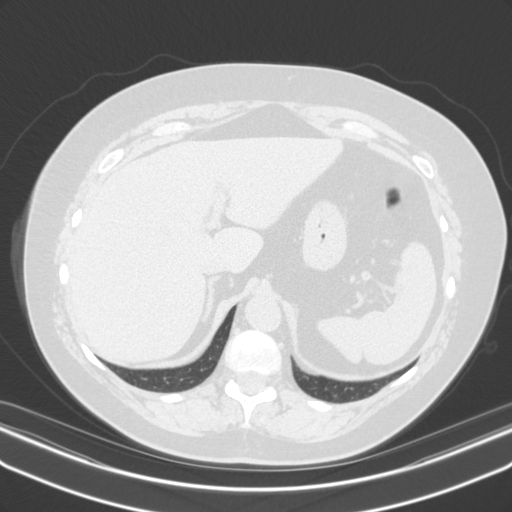
[im 40/153  lung]
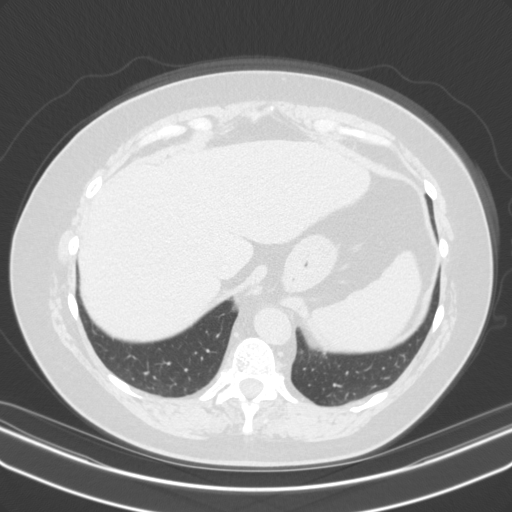
[im 51/153  mediastinal]
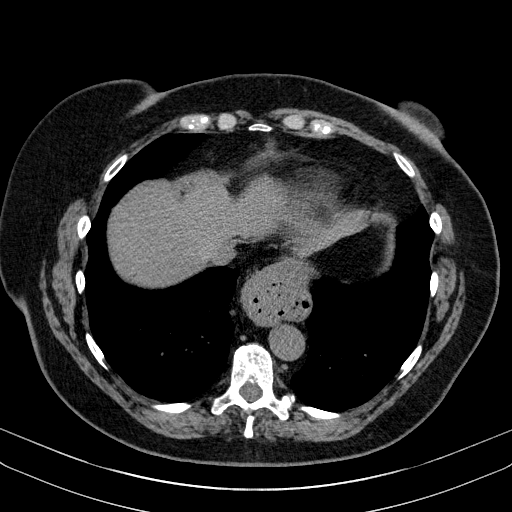
[im 51/153  lung]
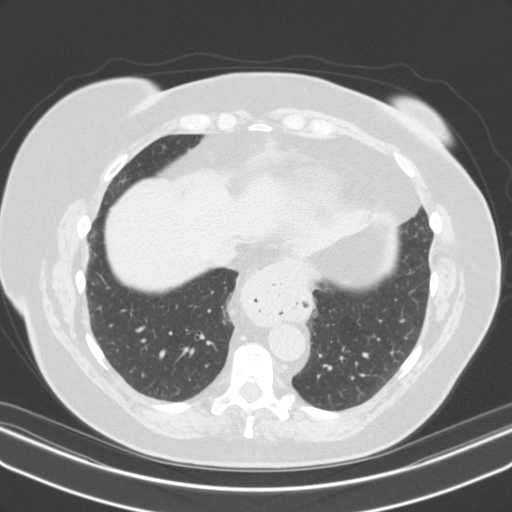
[im 61/153  lung]
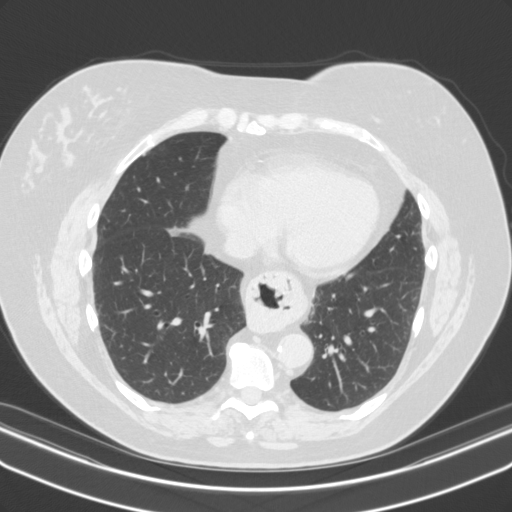
[im 68/153  lung]
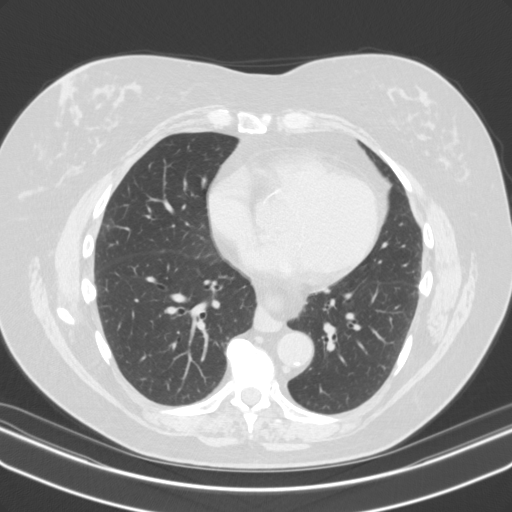
[im 79/153  lung]
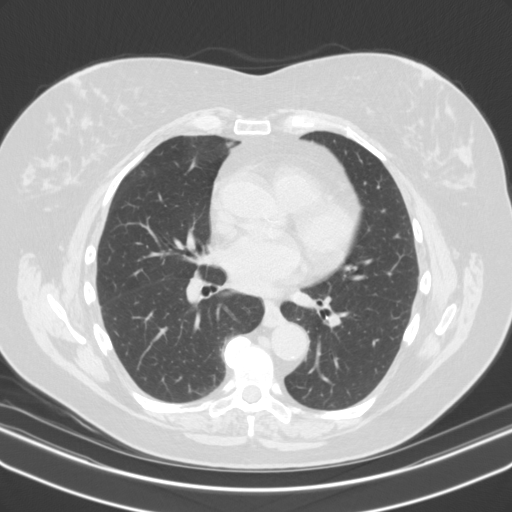
[im 85/153  mediastinal]
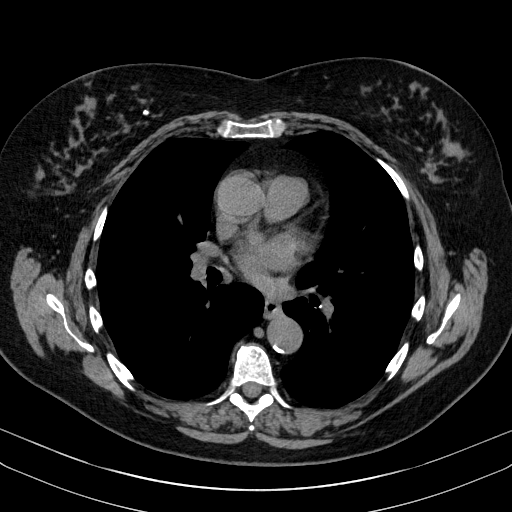
[im 85/153  lung]
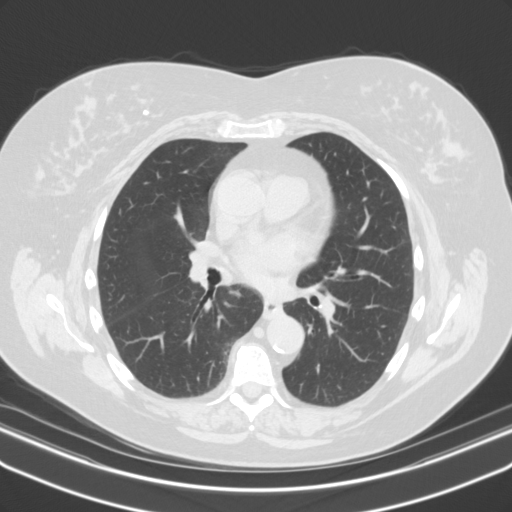
[im 92/153  lung]
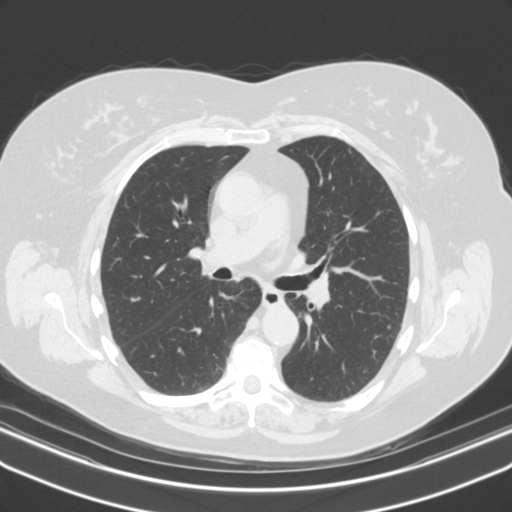
[im 102/153  lung]
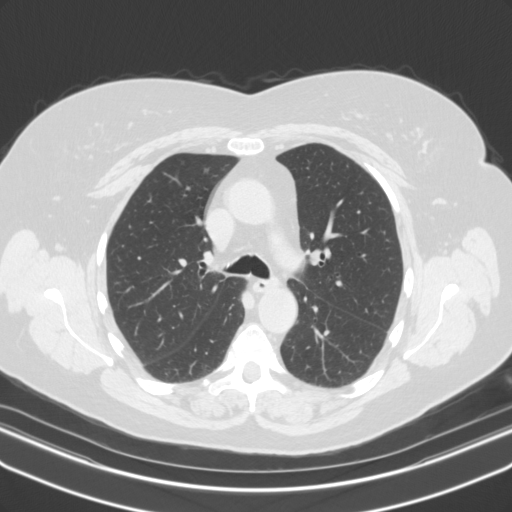
[im 113/153  lung]
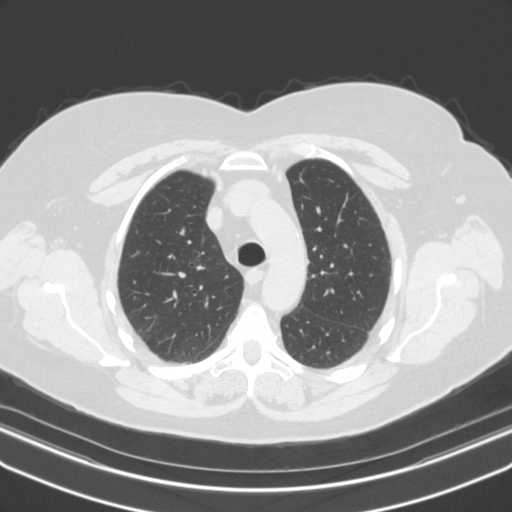
[im 122/153  mediastinal]
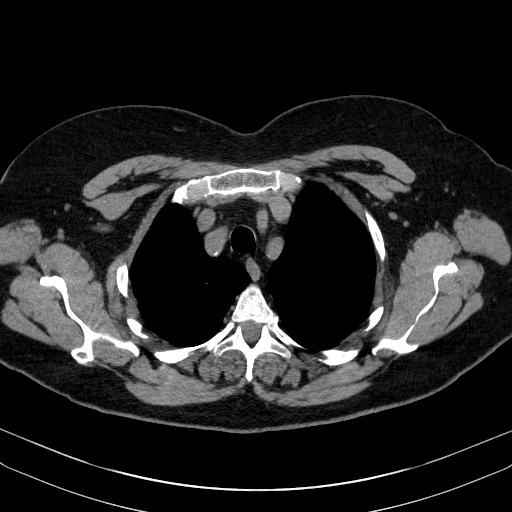
[im 122/153  lung]
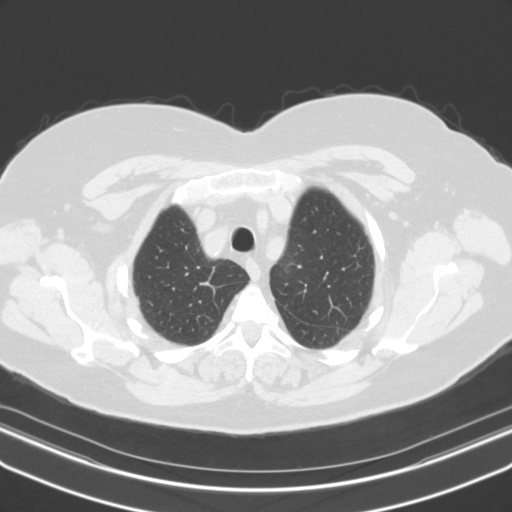
[im 130/153  lung]
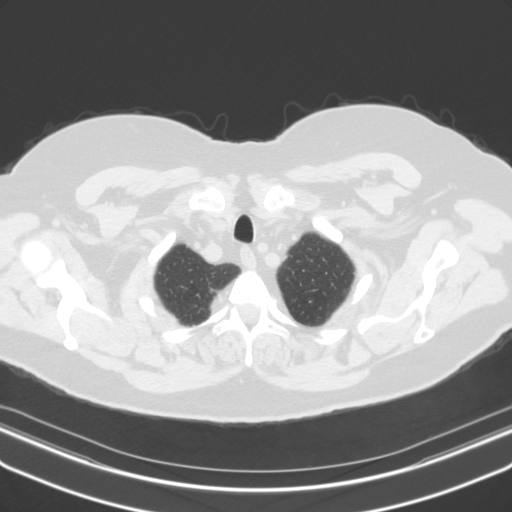
[im 141/153  lung]
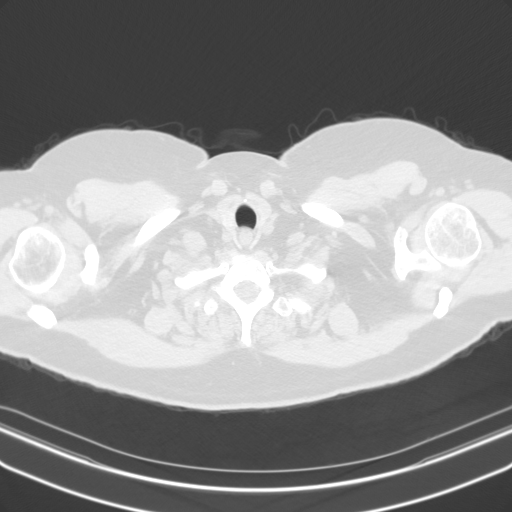

[15 of 34 positions shown; findings below may reference images not displayed]

FINDINGS: Cardiovascular: Heart is normal in size.  No pericardial effusion.

No evidence of thoracic aortic aneurysm. Mild atherosclerotic
calcifications of the aortic arch.

Mediastinum/Nodes: No suspicious mediastinal lymphadenopathy.

Visualized thyroid is unremarkable.

Lungs/Pleura: Biapical pleural-parenchymal scarring.

No focal consolidation. Mild compressive atelectasis in the medial
right lower lobe.

Stable 5 mm triangular subpleural nodule in the right middle lobe
(series 3/image 94), benign. No new/suspicious pulmonary nodules.

No pleural effusion or pneumothorax.

Upper Abdomen: Visualized upper abdomen is notable for a moderate
hiatal hernia and mild vascular calcifications.

Musculoskeletal: Degenerative changes of the visualized
thoracolumbar spine.
IMPRESSION: Stable 5 mm subpleural nodule in the right middle lobe, benign.
Dedicated follow-up imaging is not required per [HOSPITAL]
guidelines. This recommendation follows the consensus statement:
Guidelines for Management of Small Pulmonary Nodules Detected on CT
Images: From the [HOSPITAL] 1068; Radiology 1068;

Aortic Atherosclerosis (6A4MG-BUS.S).

## 2022-03-30 DIAGNOSIS — I10 Essential (primary) hypertension: Secondary | ICD-10-CM | POA: Diagnosis not present

## 2022-03-30 DIAGNOSIS — R7303 Prediabetes: Secondary | ICD-10-CM | POA: Diagnosis not present

## 2022-03-30 DIAGNOSIS — E782 Mixed hyperlipidemia: Secondary | ICD-10-CM | POA: Diagnosis not present

## 2022-03-31 DIAGNOSIS — H5213 Myopia, bilateral: Secondary | ICD-10-CM | POA: Diagnosis not present

## 2022-03-31 DIAGNOSIS — H25813 Combined forms of age-related cataract, bilateral: Secondary | ICD-10-CM | POA: Diagnosis not present

## 2022-04-07 DIAGNOSIS — E782 Mixed hyperlipidemia: Secondary | ICD-10-CM | POA: Diagnosis not present

## 2022-04-07 DIAGNOSIS — F411 Generalized anxiety disorder: Secondary | ICD-10-CM | POA: Diagnosis not present

## 2022-04-07 DIAGNOSIS — I10 Essential (primary) hypertension: Secondary | ICD-10-CM | POA: Diagnosis not present

## 2022-04-07 DIAGNOSIS — Z6830 Body mass index (BMI) 30.0-30.9, adult: Secondary | ICD-10-CM | POA: Diagnosis not present

## 2022-04-07 DIAGNOSIS — Z0000 Encounter for general adult medical examination without abnormal findings: Secondary | ICD-10-CM | POA: Diagnosis not present

## 2022-04-07 DIAGNOSIS — R7303 Prediabetes: Secondary | ICD-10-CM | POA: Diagnosis not present

## 2022-04-07 DIAGNOSIS — N952 Postmenopausal atrophic vaginitis: Secondary | ICD-10-CM | POA: Diagnosis not present

## 2022-04-07 DIAGNOSIS — N1831 Chronic kidney disease, stage 3a: Secondary | ICD-10-CM | POA: Diagnosis not present

## 2022-04-07 DIAGNOSIS — L719 Rosacea, unspecified: Secondary | ICD-10-CM | POA: Diagnosis not present

## 2022-04-07 DIAGNOSIS — M5431 Sciatica, right side: Secondary | ICD-10-CM | POA: Diagnosis not present

## 2022-04-07 DIAGNOSIS — K219 Gastro-esophageal reflux disease without esophagitis: Secondary | ICD-10-CM | POA: Diagnosis not present

## 2022-06-20 DIAGNOSIS — N899 Noninflammatory disorder of vagina, unspecified: Secondary | ICD-10-CM | POA: Diagnosis not present

## 2022-06-20 DIAGNOSIS — L292 Pruritus vulvae: Secondary | ICD-10-CM | POA: Diagnosis not present

## 2022-06-20 DIAGNOSIS — Z6830 Body mass index (BMI) 30.0-30.9, adult: Secondary | ICD-10-CM | POA: Diagnosis not present

## 2022-06-20 DIAGNOSIS — N390 Urinary tract infection, site not specified: Secondary | ICD-10-CM | POA: Diagnosis not present

## 2022-06-20 DIAGNOSIS — Z01419 Encounter for gynecological examination (general) (routine) without abnormal findings: Secondary | ICD-10-CM | POA: Diagnosis not present

## 2022-06-20 DIAGNOSIS — M8580 Other specified disorders of bone density and structure, unspecified site: Secondary | ICD-10-CM | POA: Diagnosis not present

## 2022-06-27 DIAGNOSIS — Z1231 Encounter for screening mammogram for malignant neoplasm of breast: Secondary | ICD-10-CM | POA: Diagnosis not present

## 2022-07-20 ENCOUNTER — Other Ambulatory Visit: Payer: Self-pay | Admitting: Neurosurgery

## 2022-07-20 DIAGNOSIS — M5416 Radiculopathy, lumbar region: Secondary | ICD-10-CM | POA: Diagnosis not present

## 2022-07-26 ENCOUNTER — Ambulatory Visit
Admission: RE | Admit: 2022-07-26 | Discharge: 2022-07-26 | Disposition: A | Discharge status: Home / Self Care | Payer: PPO | Source: Ambulatory Visit | Attending: Neurosurgery | Admitting: Neurosurgery

## 2022-07-26 DIAGNOSIS — M48061 Spinal stenosis, lumbar region without neurogenic claudication: Secondary | ICD-10-CM | POA: Diagnosis not present

## 2022-07-26 DIAGNOSIS — M5416 Radiculopathy, lumbar region: Secondary | ICD-10-CM

## 2022-07-26 DIAGNOSIS — M4316 Spondylolisthesis, lumbar region: Secondary | ICD-10-CM | POA: Diagnosis not present

## 2022-07-26 DIAGNOSIS — M545 Low back pain, unspecified: Secondary | ICD-10-CM | POA: Diagnosis not present

## 2022-08-03 DIAGNOSIS — L292 Pruritus vulvae: Secondary | ICD-10-CM | POA: Diagnosis not present

## 2022-08-03 DIAGNOSIS — N898 Other specified noninflammatory disorders of vagina: Secondary | ICD-10-CM | POA: Diagnosis not present

## 2022-08-09 DIAGNOSIS — M4310 Spondylolisthesis, site unspecified: Secondary | ICD-10-CM | POA: Diagnosis not present

## 2022-08-09 DIAGNOSIS — M5416 Radiculopathy, lumbar region: Secondary | ICD-10-CM | POA: Diagnosis not present

## 2022-08-09 DIAGNOSIS — Z6830 Body mass index (BMI) 30.0-30.9, adult: Secondary | ICD-10-CM | POA: Diagnosis not present

## 2022-08-15 ENCOUNTER — Ambulatory Visit: Payer: PPO | Admitting: Physical Therapy

## 2022-08-22 NOTE — Therapy (Signed)
OUTPATIENT PHYSICAL THERAPY THORACOLUMBAR EVALUATION   Patient Name: Deborah Adams MRN: [id number] DOB:[birth year], 71 y.o., female Today's Date: 08/23/2022   PT End of Session - 08/23/22 1225     Visit Number 1    Date for PT Re-Evaluation 10/18/22    Authorization Type Medicare Advantage    Progress Note Due on Visit 10    PT Start Time 1148    PT Stop Time 1226    PT Time Calculation (min) 38 min    Activity Tolerance Patient tolerated treatment well    Behavior During Therapy WFL for tasks assessed/performed             Past Medical History:  Diagnosis Date   GERD (gastroesophageal reflux disease)    Hyperlipidemia    Hypertension    Past Surgical History:  Procedure Laterality Date   ABDOMINAL HYSTERECTOMY     BUNIONECTOMY Right    WRIST SURGERY Left    Patient Active Problem List   Diagnosis Date Noted   Thoracic back pain 12/25/2018   HTN (hypertension) 01/12/2011   Hyperlipemia 01/12/2011   GERD (gastroesophageal reflux disease) 01/12/2011   Chronic allergic rhinitis 01/12/2011   Esophageal stricture 01/12/2011   Cystocele 01/12/2011   Stress incontinence, female 01/12/2011    PCP: Lona Kettle, MD  REFERRING PROVIDER: Earnie Larsson, MD  REFERRING DIAG:  Diagnosis  M43.10 (ICD-10-CM) - Spondylolisthesis, site unspecified    Rationale for Evaluation and Treatment: Rehabilitation  THERAPY DIAG:  Other low back pain  Cramp and spasm  ONSET DATE: chronic   SUBJECTIVE:                                                                                                                                                                                           SUBJECTIVE STATEMENT: Pt presents to PT with her husband with reports of LBP and Rt LE pain that is chronic.  Pt had recent MRI-see below  PERTINENT HISTORY:  HTN  PAIN:  Are you having pain? Yes: NPRS scale: 2/10 now.  Up to 3/10.  Pain location: Low back into Rt LE Pain description:  aching Aggravating factors: sitting long periods, early morning or late in day, being active  Relieving factors: Tylenol, rest/supine  PRECAUTIONS: None  WEIGHT BEARING RESTRICTIONS: No  FALLS:  Has patient fallen in last 6 months? No  LIVING ENVIRONMENT: Lives with: lives with their spouse Lives in: House/apartment Stairs: yes  Has following equipment at home: None  OCCUPATION: retired   PLOF: Independent and Leisure: gardening  PATIENT GOALS: reduce LBP, gardening, return to walking   NEXT MD VISIT: 09/13/22  OBJECTIVE:  DIAGNOSTIC FINDINGS:  MRI: IMPRESSION: 1. No acute findings or clear explanation for right lower extremity radicular symptoms. 2. Mild asymmetric narrowing of the left lateral recess at L3-4 with possible left L4 nerve root encroachment. 3. Mild spinal stenosis and mild asymmetric right lateral recess narrowing at L4-5 with potential extraforaminal right L4 nerve root encroachment. 4. Possible extraforaminal left L5 nerve root encroachment by disc bulging and facet hypertrophy at L5-S1.    PATIENT SURVEYS:  FOTO 46 (goal is 45)  SCREENING FOR RED FLAGS: Bowel or bladder incontinence: No Spinal tumors: No Cauda equina syndrome: No Compression fracture: No Abdominal aneurysm: No  COGNITION: Overall cognitive status: Within functional limits for tasks assessed     SENSATION: WFL  MUSCLE LENGTH: Rt limited by 30%, Lt limited by 25%  POSTURE: rounded shoulders, forward head, and flexed trunk   PALPATION: Diffuse palpable tenderness over bil lumbar paraspinals and Rt>Lt gluteals.  Significant reduction in spinal mobility with PA mobs in thoracic and lumbar spine with pain L1-4.  Tender over bil SI joints  LUMBAR ROM:   AROM eval  Flexion Full-pain  Extension Limited by 50%-pain  Right lateral flexion Limited by 25%- pain  Left lateral flexion full  Right rotation   Left rotation    (Blank rows = not tested)  LOWER EXTREMITY ROM:      Rt hip pain with P/ROM Rt hip into flexion.  Bil hip rotation is limited by 25-50%   LOWER EXTREMITY MMT:    4+/5 Rt LE, 5/5 Lt LE  LUMBAR SPECIAL TESTS:  Slump test: NegativeRt is negative    GAIT: Distance walked: 50 Assistive device utilized: None Level of assistance: Complete Independence Comments: slow mobility with reduce trunk rotation  TODAY'S TREATMENT:               DATE: 08/23/22  HEP established- see below   PATIENT EDUCATION:  Education details: Access Code: [email or web address] Person educated: Patient Education method: Explanation, Demonstration, and Handouts Education comprehension: verbalized understanding and returned demonstration  HOME EXERCISE PROGRAM: Access Code: [email or web address] URL: [email or web address] Date: 08/23/2022 Prepared by: Claiborne Billings  Exercises - Supine Lower Trunk Rotation  - 3 x daily - 7 x weekly - 1 sets - 3 reps - 20 hold - Hooklying Single Knee to Chest  - 3 x daily - 7 x weekly - 1 sets - 3 reps - 20 hold - Seated Hamstring Stretch  - 3 x daily - 7 x weekly - 1 sets - 3 reps - 20 hold - Seated Figure 4 Piriformis Stretch  - 3 x daily - 7 x weekly - 1 sets - 3 reps - 20 hold  Patient Education - walking program   ASSESSMENT:  CLINICAL IMPRESSION: Patient is a 71 y.o. female who was seen today for physical therapy evaluation and treatment for LBP and Rt LE pain that is chronic.  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   Pt is limited to sitting 45 minutes, walking > 10 minutes and is not able to do her usual gardening due to pain. Pt doesn't experience any changes in her Rt LE pain with lumbar A/ROM.  Pt with lumbar and hip ROM/flexibility deficits, reduced spinal mobility and tension/trigger points with tenderness over bil lumbar paraspinals and Rt>Lt gluteals.  Patient will benefit from skilled PT to address the below impairments and improve  overall function.  OBJECTIVE IMPAIRMENTS: decreased activity tolerance, decreased balance, decreased endurance, difficulty walking, decreased ROM, decreased strength, hypomobility, increased muscle spasms, impaired flexibility, improper body mechanics, postural dysfunction, and pain.   ACTIVITY LIMITATIONS: carrying, lifting, sitting, standing, bed mobility, and locomotion level  PARTICIPATION LIMITATIONS: meal prep, cleaning, laundry, shopping, community activity, and yard work  PERSONAL FACTORS: Past/current experiences and 1 comorbidity: time since onset and spinal stensis  are also affecting patient's functional outcome.   REHAB POTENTIAL: Good  CLINICAL DECISION MAKING: Stable/uncomplicated  EVALUATION COMPLEXITY: Low   GOALS: Goals reviewed with patient? Yes  SHORT TERM GOALS: Target date: 09/20/2022  Be independent in initial HEP Baseline: Goal status: INITIAL  2.  Report > or = to 30% reduction in LBP and Rt LE pain with ADLs and self-care Baseline:  Goal status: INITIAL  3.  Verbalize and demonstrate body mechanics modifications for spinal protection with daily tasks  Baseline:  Goal status: INITIAL  4.  Initiate a walking program and verbalize safe progression to improve endurance  Baseline:  Goal status: INITIAL    LONG TERM GOALS: Target date: 10/18/2022  Be independent in advanced HEP Baseline:  Goal status: INITIAL  2.  Improve FOTO to > or = to 59 Baseline:  Goal status: INITIAL  3.  Report > or = to 60% reduction in LBP and Rt LE pain with ADLs and self-care Baseline: 2-4/10 Goal status: INITIAL  4.  Return to gardening and verbalize modifications for rest breaks  Baseline:  Goal status: INITIAL  5.  Sit for > or = to 1 hour for longer car rides without increased LBP and Rt LE pain Baseline:  Goal status: INITIAL    PLAN:  PT FREQUENCY: 2x/week  PT DURATION: 8 weeks  PLANNED INTERVENTIONS: Therapeutic exercises, Therapeutic  activity, Neuromuscular re-education, Balance training, Gait training, Patient/Family education, Self Care, Joint mobilization, Joint manipulation, Stair training, Aquatic Therapy, Dry Needling, Electrical stimulation, Spinal manipulation, Spinal mobilization, Taping, Ultrasound, Manual therapy, and Re-evaluation.  PLAN FOR NEXT SESSION: review HEP, body mechanics education, lumbar traction, DN to lumbar and Rt gluteals (have not discussed this with patient), Zara Chess, PT 08/23/22 12:26 PM

## 2022-08-23 ENCOUNTER — Ambulatory Visit: Payer: PPO | Admitting: Rehabilitative and Restorative Service Providers"

## 2022-08-23 ENCOUNTER — Ambulatory Visit: Payer: PPO | Attending: Neurosurgery

## 2022-08-23 ENCOUNTER — Other Ambulatory Visit: Payer: Self-pay

## 2022-08-23 DIAGNOSIS — M5459 Other low back pain: Secondary | ICD-10-CM | POA: Diagnosis not present

## 2022-08-23 DIAGNOSIS — R252 Cramp and spasm: Secondary | ICD-10-CM | POA: Diagnosis not present

## 2022-08-23 DIAGNOSIS — M79604 Pain in right leg: Secondary | ICD-10-CM | POA: Diagnosis not present

## 2022-08-30 ENCOUNTER — Ambulatory Visit: Payer: PPO | Admitting: Physical Therapy

## 2022-09-11 ENCOUNTER — Ambulatory Visit: Payer: PPO | Attending: Neurosurgery

## 2022-09-11 DIAGNOSIS — M79604 Pain in right leg: Secondary | ICD-10-CM | POA: Diagnosis not present

## 2022-09-11 DIAGNOSIS — R252 Cramp and spasm: Secondary | ICD-10-CM | POA: Diagnosis not present

## 2022-09-11 DIAGNOSIS — M5459 Other low back pain: Secondary | ICD-10-CM | POA: Insufficient documentation

## 2022-09-11 NOTE — Therapy (Signed)
OUTPATIENT PHYSICAL THERAPY TREATMENT   Patient Name: Deborah Adams MRN: [id number] DOB:Feb 03, 1951, 71 y.o., female Today's Date: 09/11/2022   PT End of Session - 09/11/22 1057     Visit Number 2    Date for PT Re-Evaluation 10/18/22    Authorization Type Medicare Advantage    Progress Note Due on Visit 10    PT Start Time 1016    PT Stop Time 1057    PT Time Calculation (min) 41 min    Activity Tolerance Patient tolerated treatment well    Behavior During Therapy WFL for tasks assessed/performed              Past Medical History:  Diagnosis Date   GERD (gastroesophageal reflux disease)    Hyperlipidemia    Hypertension    Past Surgical History:  Procedure Laterality Date   ABDOMINAL HYSTERECTOMY     BUNIONECTOMY Right    WRIST SURGERY Left    Patient Active Problem List   Diagnosis Date Noted   Thoracic back pain 12/25/2018   HTN (hypertension) 01/12/2011   Hyperlipemia 01/12/2011   GERD (gastroesophageal reflux disease) 01/12/2011   Chronic allergic rhinitis 01/12/2011   Esophageal stricture 01/12/2011   Cystocele 01/12/2011   Stress incontinence, female 01/12/2011    PCP: Lona Kettle, MD  REFERRING PROVIDER: Earnie Larsson, MD  REFERRING DIAG:  Diagnosis  M43.10 (ICD-10-CM) - Spondylolisthesis, site unspecified    Rationale for Evaluation and Treatment: Rehabilitation  THERAPY DIAG:  Other low back pain  Cramp and spasm  Pain in right leg  ONSET DATE: chronic   SUBJECTIVE:                                                                                                                                                                                           SUBJECTIVE STATEMENT: I have been sick so haven't had much chance to do my exercises.  The one where I cross my leg over is really hard.    PERTINENT HISTORY:  HTN  PAIN:  Are you having pain? Yes: NPRS scale: 0/10   Pain location: Low back into Rt LE Pain description:  aching Aggravating factors: sitting long periods, early morning or late in day, being active  Relieving factors: Tylenol, rest/supine  PRECAUTIONS: None  WEIGHT BEARING RESTRICTIONS: No  FALLS:  Has patient fallen in last 6 months? No  LIVING ENVIRONMENT: Lives with: lives with their spouse Lives in: House/apartment Stairs: yes  Has following equipment at home: None  OCCUPATION: retired   PLOF: Independent and Leisure: gardening  PATIENT GOALS: reduce LBP, gardening, return to walking   NEXT MD VISIT:  09/13/22  OBJECTIVE:   DIAGNOSTIC FINDINGS:  MRI: IMPRESSION: 1. No acute findings or clear explanation for right lower extremity radicular symptoms. 2. Mild asymmetric narrowing of the left lateral recess at L3-4 with possible left L4 nerve root encroachment. 3. Mild spinal stenosis and mild asymmetric right lateral recess narrowing at L4-5 with potential extraforaminal right L4 nerve root encroachment. 4. Possible extraforaminal left L5 nerve root encroachment by disc bulging and facet hypertrophy at L5-S1.    PATIENT SURVEYS:  FOTO 98 (goal is 38)  SCREENING FOR RED FLAGS: Bowel or bladder incontinence: No Spinal tumors: No Cauda equina syndrome: No Compression fracture: No Abdominal aneurysm: No  COGNITION: Overall cognitive status: Within functional limits for tasks assessed     SENSATION: WFL  MUSCLE LENGTH: Rt limited by 30%, Lt limited by 25%  POSTURE: rounded shoulders, forward head, and flexed trunk   PALPATION: Diffuse palpable tenderness over bil lumbar paraspinals and Rt>Lt gluteals.  Significant reduction in spinal mobility with PA mobs in thoracic and lumbar spine with pain L1-4.  Tender over bil SI joints  LUMBAR ROM:   AROM eval  Flexion Full-pain  Extension Limited by 50%-pain  Right lateral flexion Limited by 25%- pain  Left lateral flexion full  Right rotation   Left rotation    (Blank rows = not tested)  LOWER EXTREMITY ROM:      Rt hip pain with P/ROM Rt hip into flexion.  Bil hip rotation is limited by 25-50%   LOWER EXTREMITY MMT:   4+/5 Rt LE, 5/5 Lt LE  LUMBAR SPECIAL TESTS:  Slump test: NegativeRt is negative    GAIT: Distance walked: 50 Assistive device utilized: None Level of assistance: Complete Independence Comments: slow mobility with reduce trunk rotation  TODAY'S TREATMENT:               DATE: 09/11/22  NuStep: Level 4x 6 minutes-PT present to discuss progress Hamstring stretch seated 3x20 seconds  Supine trunk rotation: 3x20 seconds  Knee to chest: 3x20 seconds  Supine piriformis figure 4, 2 positions  3x20 seconds  TA activation with ball squeeze 5" hold x15  TODAY'S TREATMENT:               DATE: 08/23/22  HEP established- see below   PATIENT EDUCATION:  Education details: Access Code: [email or web address], walking program (09/11/22) Person educated: Patient Education method: Explanation, Demonstration, and Handouts Education comprehension: verbalized understanding and returned demonstration  HOME EXERCISE PROGRAM: Access Code: [email or web address] URL: [email or web address] Date: 09/11/2022 Prepared by: Claiborne Billings  Exercises - Supine Lower Trunk Rotation  - 3 x daily - 7 x weekly - 1 sets - 3 reps - 20 hold - Hooklying Single Knee to Chest  - 3 x daily - 7 x weekly - 1 sets - 3 reps - 20 hold - Seated Hamstring Stretch  - 3 x daily - 7 x weekly - 1 sets - 3 reps - 20 hold - Supine Figure 4 Piriformis Stretch  - 3 x daily - 7 x weekly - 1 sets - 3 reps - 20 hold - Supine Piriformis Stretch  - 1 x daily - 7 x weekly - 3 reps - 20 hold  Patient Education - walking program   ASSESSMENT:  CLINICAL IMPRESSION: Pt with lapse in treatment x 2 weeks due to illness.  Pt reports that she wasn't able to do her exercises much due to being sick.  Pt denies any pain over the past week in her low back.  PT  discussed walking program with pt and how to progress slowly and safely as able.  PT did well  with review of HEP and PT modified to perform in supine due to limited ROM.  Rt hip and lumbar spine is tighter vs the Lt.  PT monitored for safety and technique.   Patient will benefit from skilled PT to address the below impairments and improve overall function.   OBJECTIVE IMPAIRMENTS: decreased activity tolerance, decreased balance, decreased endurance, difficulty walking, decreased ROM, decreased strength, hypomobility, increased muscle spasms, impaired flexibility, improper body mechanics, postural dysfunction, and pain.   ACTIVITY LIMITATIONS: carrying, lifting, sitting, standing, bed mobility, and locomotion level  PARTICIPATION LIMITATIONS: meal prep, cleaning, laundry, shopping, community activity, and yard work  PERSONAL FACTORS: Past/current experiences and 1 comorbidity: time since onset and spinal stensis  are also affecting patient's functional outcome.   REHAB POTENTIAL: Good  CLINICAL DECISION MAKING: Stable/uncomplicated  EVALUATION COMPLEXITY: Low   GOALS: Goals reviewed with patient? Yes  SHORT TERM GOALS: Target date: 09/20/2022  Be independent in initial HEP Baseline: Goal status: In progress   2.  Report > or = to 30% reduction in LBP and Rt LE pain with ADLs and self-care Baseline: no pain today (09/11/22) Goal status: IN progress   3.  Verbalize and demonstrate body mechanics modifications for spinal protection with daily tasks  Baseline:  Goal status: INITIAL  4.  Initiate a walking program and verbalize safe progression to improve endurance  Baseline: discussed program today (09/11/22) Goal status: In progress     LONG TERM GOALS: Target date: 10/18/2022  Be independent in advanced HEP Baseline:  Goal status: INITIAL  2.  Improve FOTO to > or = to 59 Baseline:  Goal status: INITIAL  3.  Report > or = to 60% reduction in LBP and Rt LE pain with ADLs and self-care Baseline: 2-4/10 Goal status: INITIAL  4.  Return to gardening and verbalize  modifications for rest breaks  Baseline:  Goal status: INITIAL  5.  Sit for > or = to 1 hour for longer car rides without increased LBP and Rt LE pain Baseline:  Goal status: INITIAL    PLAN:  PT FREQUENCY: 2x/week  PT DURATION: 8 weeks  PLANNED INTERVENTIONS: Therapeutic exercises, Therapeutic activity, Neuromuscular re-education, Balance training, Gait training, Patient/Family education, Self Care, Joint mobilization, Joint manipulation, Stair training, Aquatic Therapy, Dry Needling, Electrical stimulation, Spinal manipulation, Spinal mobilization, Taping, Ultrasound, Manual therapy, and Re-evaluation.  PLAN FOR NEXT SESSION: See how walking program is going, body mechanics education.    Sigurd Sos, PT 09/11/22 10:58 AM

## 2022-09-13 ENCOUNTER — Ambulatory Visit: Payer: PPO | Admitting: Physical Therapy

## 2022-09-13 ENCOUNTER — Encounter: Payer: Self-pay | Admitting: Physical Therapy

## 2022-09-13 DIAGNOSIS — R252 Cramp and spasm: Secondary | ICD-10-CM

## 2022-09-13 DIAGNOSIS — M79604 Pain in right leg: Secondary | ICD-10-CM

## 2022-09-13 DIAGNOSIS — M5459 Other low back pain: Secondary | ICD-10-CM

## 2022-09-13 NOTE — Therapy (Signed)
OUTPATIENT PHYSICAL THERAPY TREATMENT   Patient Name: Deborah Adams MRN: [id number] DOB:May 28, 1951, 71 y.o., female Today's Date: 09/13/2022   PT End of Session - 09/13/22 1233     Visit Number 3    Date for PT Re-Evaluation 10/18/22    Authorization Type Medicare Advantage    Progress Note Due on Visit 10    PT Start Time 1233    PT Stop Time 0865    PT Time Calculation (min) 44 min    Activity Tolerance Patient tolerated treatment well    Behavior During Therapy [facility] for tasks assessed/performed               Past Medical History:  Diagnosis Date   GERD (gastroesophageal reflux disease)    Hyperlipidemia    Hypertension    Past Surgical History:  Procedure Laterality Date   ABDOMINAL HYSTERECTOMY     BUNIONECTOMY Right    WRIST SURGERY Left    Patient Active Problem List   Diagnosis Date Noted   Thoracic back pain 12/25/2018   HTN (hypertension) 01/12/2011   Hyperlipemia 01/12/2011   GERD (gastroesophageal reflux disease) 01/12/2011   Chronic allergic rhinitis 01/12/2011   Esophageal stricture 01/12/2011   Cystocele 01/12/2011   Stress incontinence, female 01/12/2011    PCP: Lona Kettle, MD  REFERRING PROVIDER: Earnie Larsson, MD  REFERRING DIAG:  Diagnosis  M43.10 (ICD-10-CM) - Spondylolisthesis, site unspecified    Rationale for Evaluation and Treatment: Rehabilitation  THERAPY DIAG:  Other low back pain  Cramp and spasm  Pain in right leg  ONSET DATE: chronic   SUBJECTIVE:                                                                                                                                                                                           SUBJECTIVE STATEMENT: I have been lazy since last appointment.     PERTINENT HISTORY:  HTN  PAIN:  Are you having pain? Yes: NPRS scale: 0/10   Pain location: Low back into Rt LE Pain description: aching Aggravating factors: sitting long periods, early morning or late in day,  being active  Relieving factors: Tylenol, rest/supine  PRECAUTIONS: None  WEIGHT BEARING RESTRICTIONS: No  FALLS:  Has patient fallen in last 6 months? No  LIVING ENVIRONMENT: Lives with: lives with their spouse Lives in: House/apartment Stairs: yes  Has following equipment at home: None  OCCUPATION: retired   PLOF: Independent and Leisure: gardening  PATIENT GOALS: reduce LBP, gardening, return to walking   NEXT MD VISIT: 09/13/22  OBJECTIVE:   DIAGNOSTIC FINDINGS:  MRI: IMPRESSION: 1. No acute findings or clear  explanation for right lower extremity radicular symptoms. 2. Mild asymmetric narrowing of the left lateral recess at L3-4 with possible left L4 nerve root encroachment. 3. Mild spinal stenosis and mild asymmetric right lateral recess narrowing at L4-5 with potential extraforaminal right L4 nerve root encroachment. 4. Possible extraforaminal left L5 nerve root encroachment by disc bulging and facet hypertrophy at L5-S1.    PATIENT SURVEYS:  FOTO 75 (goal is 104)  SCREENING FOR RED FLAGS: Bowel or bladder incontinence: No Spinal tumors: No Cauda equina syndrome: No Compression fracture: No Abdominal aneurysm: No  COGNITION: Overall cognitive status: Within functional limits for tasks assessed     SENSATION: WFL  MUSCLE LENGTH: Rt limited by 30%, Lt limited by 25%  POSTURE: rounded shoulders, forward head, and flexed trunk   PALPATION: Diffuse palpable tenderness over bil lumbar paraspinals and Rt>Lt gluteals.  Significant reduction in spinal mobility with PA mobs in thoracic and lumbar spine with pain L1-4.  Tender over bil SI joints  LUMBAR ROM:   AROM eval  Flexion Full-pain  Extension Limited by 50%-pain  Right lateral flexion Limited by 25%- pain  Left lateral flexion full  Right rotation   Left rotation    (Blank rows = not tested)  LOWER EXTREMITY ROM:     Rt hip pain with P/ROM Rt hip into flexion.  Bil hip rotation is limited by  25-50%   LOWER EXTREMITY MMT:   4+/5 Rt LE, 5/5 Lt LE  LUMBAR SPECIAL TESTS:  Slump test: NegativeRt is negative    GAIT: Distance walked: 50 Assistive device utilized: None Level of assistance: Complete Independence Comments: slow mobility with reduce trunk rotation  TODAY'S TREATMENT:               DATE: 09/11/22  NuStep: Level 4x 6 minutes-PT present to discuss progress Hamstring stretch seated 2x20 seconds  Supine trunk rotation: 3x20 seconds  Knee to chest: 3x20 seconds Supine Rt piriformis figure 4, 2 positions  2x20 seconds  Pelvic tilt supine, with exhale and TA indraw, VC and demo for form and breath coordination Rt hip flexion isometric with TA indraw supine 5x5" holds Rt hip Addaday assisted STM in Lt SL Standing bil red band row and extension x10 Wall plank 2x10" Wall push up x 5 Issued HEP for strength   TODAY'S TREATMENT:               DATE: 09/11/22  NuStep: Level 4x 6 minutes-PT present to discuss progress Hamstring stretch seated 3x20 seconds  Supine trunk rotation: 3x20 seconds  Knee to chest: 3x20 seconds  Supine piriformis figure 4, 2 positions  3x20 seconds  TA activation with ball squeeze 5" hold x15  TODAY'S TREATMENT:               DATE: 08/23/22  HEP established- see below   PATIENT EDUCATION:  Education details: Access Code: [email or web address], walking program (09/11/22) Person educated: Patient Education method: Explanation, Demonstration, and Handouts Education comprehension: verbalized understanding and returned demonstration  HOME EXERCISE PROGRAM: Access Code: [email or web address] URL: [email or web address] Date: 09/13/2022 Prepared by: Venetia Night Amanii Snethen  Exercises - Supine Lower Trunk Rotation  - 3 x daily - 7 x weekly - 1 sets - 3 reps - 20 hold - Hooklying Single Knee to Chest  - 3 x daily - 7 x weekly - 1 sets - 3 reps - 20 hold - Seated Hamstring Stretch  - 3 x daily - 7 x weekly - 1 sets - 3 reps - 20 hold -  Supine Figure 4 Piriformis  Stretch  - 3 x daily - 7 x weekly - 1 sets - 3 reps - 20 hold - Supine Piriformis Stretch  - 1 x daily - 7 x weekly - 3 reps - 20 hold - Supine Posterior Pelvic Tilt  - 1 x daily - 7 x weekly - 1 sets - 10 reps - Standing Row with Anchored Resistance  - 1 x daily - 7 x weekly - 1 sets - 10 reps - Standing Shoulder Extension with Resistance  - 1 x daily - 7 x weekly - 1 sets - 10 reps - Standing Plank on Wall  - 1 x daily - 7 x weekly - 1 sets - 3 reps - 10 sec hold - Wall Push Up  - 1 x daily - 7 x weekly - 1 sets - 10 reps  Patient Education - walking program   ASSESSMENT:  CLINICAL IMPRESSION: Pt arrived admitting she hasn't tried her HEP stretches since last visit.  PT used Addaday assisted STM to Rt hip today with Pt report of calming of soreness and pain.  Pt needed signif VC and demo for pelvic tilt, abdominal indraw, and coordination of breathwork.  Progressed core activation into standing therex and progressed HEP.  Encouraged Pt on HEP compliance.  PT monitored for safety and technique.   Patient will benefit from skilled PT to address the below impairments and improve overall function.   OBJECTIVE IMPAIRMENTS: decreased activity tolerance, decreased balance, decreased endurance, difficulty walking, decreased ROM, decreased strength, hypomobility, increased muscle spasms, impaired flexibility, improper body mechanics, postural dysfunction, and pain.   ACTIVITY LIMITATIONS: carrying, lifting, sitting, standing, bed mobility, and locomotion level  PARTICIPATION LIMITATIONS: meal prep, cleaning, laundry, shopping, community activity, and yard work  PERSONAL FACTORS: Past/current experiences and 1 comorbidity: time since onset and spinal stensis  are also affecting patient's functional outcome.   REHAB POTENTIAL: Good  CLINICAL DECISION MAKING: Stable/uncomplicated  EVALUATION COMPLEXITY: Low   GOALS: Goals reviewed with patient? Yes  SHORT TERM GOALS: Target date:  09/20/2022  Be independent in initial HEP Baseline: Goal status: In progress   2.  Report > or = to 30% reduction in LBP and Rt LE pain with ADLs and self-care Baseline: no pain today (09/11/22) Goal status: IN progress   3.  Verbalize and demonstrate body mechanics modifications for spinal protection with daily tasks  Baseline:  Goal status: INITIAL  4.  Initiate a walking program and verbalize safe progression to improve endurance  Baseline: discussed program today (09/11/22) Goal status: In progress     LONG TERM GOALS: Target date: 10/18/2022  Be independent in advanced HEP Baseline:  Goal status: INITIAL  2.  Improve FOTO to > or = to 59 Baseline:  Goal status: INITIAL  3.  Report > or = to 60% reduction in LBP and Rt LE pain with ADLs and self-care Baseline: 2-4/10 Goal status: INITIAL  4.  Return to gardening and verbalize modifications for rest breaks  Baseline:  Goal status: INITIAL  5.  Sit for > or = to 1 hour for longer car rides without increased LBP and Rt LE pain Baseline:  Goal status: INITIAL    PLAN:  PT FREQUENCY: 2x/week  PT DURATION: 8 weeks  PLANNED INTERVENTIONS: Therapeutic exercises, Therapeutic activity, Neuromuscular re-education, Balance training, Gait training, Patient/Family education, Self Care, Joint mobilization, Joint manipulation, Stair training, Aquatic Therapy, Dry Needling, Electrical stimulation, Spinal manipulation, Spinal mobilization, Taping, Ultrasound, Manual therapy, and Re-evaluation.  PLAN FOR NEXT SESSION: continue Addaday to Rt hip, Rt hip stretches, review therex and core from last time, progress as able, try 3' walk test with core awareness, See how walking program is going, body mechanics education.    Kailana Benninger, PT 09/13/22 1:27 PM

## 2022-09-19 ENCOUNTER — Encounter: Payer: Self-pay | Admitting: Physical Therapy

## 2022-09-19 ENCOUNTER — Ambulatory Visit: Payer: PPO | Admitting: Physical Therapy

## 2022-09-19 DIAGNOSIS — R252 Cramp and spasm: Secondary | ICD-10-CM

## 2022-09-19 DIAGNOSIS — M79604 Pain in right leg: Secondary | ICD-10-CM

## 2022-09-19 DIAGNOSIS — M5459 Other low back pain: Secondary | ICD-10-CM | POA: Diagnosis not present

## 2022-09-19 NOTE — Therapy (Signed)
OUTPATIENT PHYSICAL THERAPY TREATMENT   Patient Name: Deborah Adams MRN: [id number] DOB:[birth year], 71 y.o., female Today's Date: 09/19/2022   PT End of Session - 09/19/22 1235     Visit Number 4    Date for PT Re-Evaluation 10/18/22    Authorization Type Medicare Advantage    Progress Note Due on Visit 10    PT Start Time 1233    PT Stop Time 1315    PT Time Calculation (min) 42 min    Activity Tolerance Patient tolerated treatment well    Behavior During Therapy WFL for tasks assessed/performed                Past Medical History:  Diagnosis Date   GERD (gastroesophageal reflux disease)    Hyperlipidemia    Hypertension    Past Surgical History:  Procedure Laterality Date   ABDOMINAL HYSTERECTOMY     BUNIONECTOMY Right    WRIST SURGERY Left    Patient Active Problem List   Diagnosis Date Noted   Thoracic back pain 12/25/2018   HTN (hypertension) 01/12/2011   Hyperlipemia 01/12/2011   GERD (gastroesophageal reflux disease) 01/12/2011   Chronic allergic rhinitis 01/12/2011   Esophageal stricture 01/12/2011   Cystocele 01/12/2011   Stress incontinence, female 01/12/2011    PCP: Lona Kettle, MD  REFERRING PROVIDER: Earnie Larsson, MD  REFERRING DIAG:  Diagnosis  M43.10 (ICD-10-CM) - Spondylolisthesis, site unspecified    Rationale for Evaluation and Treatment: Rehabilitation  THERAPY DIAG:  Other low back pain  Pain in right leg  Cramp and spasm  ONSET DATE: chronic   SUBJECTIVE:                                                                                                                                                                                           SUBJECTIVE STATEMENT: I had a catch in my back for 2 days after last time but then it eased up so I have been doing some the HEP for the last 2 days without trouble.  PERTINENT HISTORY:  HTN  PAIN:  Are you having pain? Yes: NPRS scale: 0/10   Pain location: Low back into Rt  LE Pain description: aching Aggravating factors: sitting long periods, early morning or late in day, being active  Relieving factors: Tylenol, rest/supine  PRECAUTIONS: None  WEIGHT BEARING RESTRICTIONS: No  FALLS:  Has patient fallen in last 6 months? No  LIVING ENVIRONMENT: Lives with: lives with their spouse Lives in: House/apartment Stairs: yes  Has following equipment at home: None  OCCUPATION: retired   PLOF: Independent and Leisure: gardening  PATIENT GOALS: reduce LBP, gardening,  return to walking   NEXT MD VISIT: 09/13/22  OBJECTIVE:   DIAGNOSTIC FINDINGS:  MRI: IMPRESSION: 1. No acute findings or clear explanation for right lower extremity radicular symptoms. 2. Mild asymmetric narrowing of the left lateral recess at L3-4 with possible left L4 nerve root encroachment. 3. Mild spinal stenosis and mild asymmetric right lateral recess narrowing at L4-5 with potential extraforaminal right L4 nerve root encroachment. 4. Possible extraforaminal left L5 nerve root encroachment by disc bulging and facet hypertrophy at L5-S1.    PATIENT SURVEYS:  FOTO 86 (goal is 21)  SCREENING FOR RED FLAGS: Bowel or bladder incontinence: No Spinal tumors: No Cauda equina syndrome: No Compression fracture: No Abdominal aneurysm: No  COGNITION: Overall cognitive status: Within functional limits for tasks assessed     SENSATION: WFL  MUSCLE LENGTH: Rt limited by 30%, Lt limited by 25%  POSTURE: rounded shoulders, forward head, and flexed trunk   PALPATION: Diffuse palpable tenderness over bil lumbar paraspinals and Rt>Lt gluteals.  Significant reduction in spinal mobility with PA mobs in thoracic and lumbar spine with pain L1-4.  Tender over bil SI joints  LUMBAR ROM:   AROM eval  Flexion Full-pain  Extension Limited by 50%-pain  Right lateral flexion Limited by 25%- pain  Left lateral flexion full  Right rotation   Left rotation    (Blank rows = not  tested)  LOWER EXTREMITY ROM:     Rt hip pain with P/ROM Rt hip into flexion.  Bil hip rotation is limited by 25-50%   LOWER EXTREMITY MMT:   4+/5 Rt LE, 5/5 Lt LE  LUMBAR SPECIAL TESTS:  Slump test: NegativeRt is negative    GAIT: Distance walked: 50 Assistive device utilized: None Level of assistance: Complete Independence Comments: slow mobility with reduce trunk rotation  TODAY'S TREATMENT:               DATE:  09/19/22: NuStep: Level 4x 6 minutes-PT present to discuss progress Standing red band row and extension 2x10 LAQ 2.5lb 3x5 each Seated march 2.5lb x20 (pain in bil hip flexors) Seated hip abd yellow loop band x 20 Sit to stand x5, PT demo hip hinge for COG over LE Wall push up x10 Supine lower trunk rotation x 10 Supine pelvic tilt - good Pt demo of exhale with TA indraw coordination today Articulating bridge x10  09/11/22  NuStep: Level 4x 6 minutes-PT present to discuss progress Hamstring stretch seated 2x20 seconds  Supine trunk rotation: 3x20 seconds  Knee to chest: 3x20 seconds Supine Rt piriformis figure 4, 2 positions  2x20 seconds  Pelvic tilt supine, with exhale and TA indraw, VC and demo for form and breath coordination Rt hip flexion isometric with TA indraw supine 5x5" holds Rt hip Addaday assisted STM in Lt SL Standing bil red band row and extension x10 Wall plank 2x10" Wall push up x 5 Issued HEP for strength   TODAY'S TREATMENT:               DATE: 09/11/22  NuStep: Level 4x 6 minutes-PT present to discuss progress Hamstring stretch seated 3x20 seconds  Supine trunk rotation: 3x20 seconds  Knee to chest: 3x20 seconds  Supine piriformis figure 4, 2 positions  3x20 seconds  TA activation with ball squeeze 5" hold x15     PATIENT EDUCATION:  Education details: Access Code: [email or web address], walking program (09/11/22) Person educated: Patient Education method: Explanation, Demonstration, and Handouts Education comprehension: verbalized  understanding and returned demonstration  HOME EXERCISE PROGRAM:  Access Code: [email or web address] URL: [email or web address] Date: 09/13/2022 Prepared by: Venetia Night Makana Feigel  Exercises - Supine Lower Trunk Rotation  - 3 x daily - 7 x weekly - 1 sets - 3 reps - 20 hold - Hooklying Single Knee to Chest  - 3 x daily - 7 x weekly - 1 sets - 3 reps - 20 hold - Seated Hamstring Stretch  - 3 x daily - 7 x weekly - 1 sets - 3 reps - 20 hold - Supine Figure 4 Piriformis Stretch  - 3 x daily - 7 x weekly - 1 sets - 3 reps - 20 hold - Supine Piriformis Stretch  - 1 x daily - 7 x weekly - 3 reps - 20 hold - Supine Posterior Pelvic Tilt  - 1 x daily - 7 x weekly - 1 sets - 10 reps - Standing Row with Anchored Resistance  - 1 x daily - 7 x weekly - 1 sets - 10 reps - Standing Shoulder Extension with Resistance  - 1 x daily - 7 x weekly - 1 sets - 10 reps - Standing Plank on Wall  - 1 x daily - 7 x weekly - 1 sets - 3 reps - 10 sec hold - Wall Push Up  - 1 x daily - 7 x weekly - 1 sets - 10 reps  Patient Education - walking program   ASSESSMENT:  CLINICAL IMPRESSION: Pt had a hinge in her back x 2 days after last time but it went away and she has been compliant with HEP the last 2 days without pain.  She has not started a walking program yet due to neighborhood not being friendly for walking - she'd need to drive to a park.  She has bil knee pain which PT is careful to work around with therex progression.  Pt was able to progress chair therex and standing reps today with good tolerance.  She is stretching Rt hip into deeper positions today compared to last visit.  Able to add articulating bridge progression today to pelvic tilt.  Continue along POC.  OBJECTIVE IMPAIRMENTS: decreased activity tolerance, decreased balance, decreased endurance, difficulty walking, decreased ROM, decreased strength, hypomobility, increased muscle spasms, impaired flexibility, improper body mechanics, postural dysfunction,  and pain.   ACTIVITY LIMITATIONS: carrying, lifting, sitting, standing, bed mobility, and locomotion level  PARTICIPATION LIMITATIONS: meal prep, cleaning, laundry, shopping, community activity, and yard work  PERSONAL FACTORS: Past/current experiences and 1 comorbidity: time since onset and spinal stensis  are also affecting patient's functional outcome.   REHAB POTENTIAL: Good  CLINICAL DECISION MAKING: Stable/uncomplicated  EVALUATION COMPLEXITY: Low   GOALS: Goals reviewed with patient? Yes  SHORT TERM GOALS: Target date: 09/20/2022  Be independent in initial HEP Baseline: Goal status: met   2.  Report > or = to 30% reduction in LBP and Rt LE pain with ADLs and self-care Baseline: no pain today (09/11/22) Goal status: met  3.  Verbalize and demonstrate body mechanics modifications for spinal protection with daily tasks  Baseline:  Goal status: ongoing, working on hip hinge for transfers  4.  Initiate a walking program and verbalize safe progression to improve endurance  Baseline: discussed program today (09/11/22) Goal status: In progress     LONG TERM GOALS: Target date: 10/18/2022  Be independent in advanced HEP Baseline:  Goal status: INITIAL  2.  Improve FOTO to > or = to 59 Baseline:  Goal status: INITIAL  3.  Report > or = to 60% reduction  in LBP and Rt LE pain with ADLs and self-care Baseline: 2-4/10 Goal status: INITIAL  4.  Return to gardening and verbalize modifications for rest breaks  Baseline:  Goal status: INITIAL  5.  Sit for > or = to 1 hour for longer car rides without increased LBP and Rt LE pain Baseline:  Goal status: INITIAL    PLAN:  PT FREQUENCY: 2x/week  PT DURATION: 8 weeks  PLANNED INTERVENTIONS: Therapeutic exercises, Therapeutic activity, Neuromuscular re-education, Balance training, Gait training, Patient/Family education, Self Care, Joint mobilization, Joint manipulation, Stair training, Aquatic Therapy, Dry Needling,  Electrical stimulation, Spinal manipulation, Spinal mobilization, Taping, Ultrasound, Manual therapy, and Re-evaluation.  PLAN FOR NEXT SESSION: f/u on walking program goal, continue Addaday to Rt hip, Rt hip stretches, review therex and core from last time, progress as able, try 3' walk test with core awareness, See how walking program is going, body mechanics education.    Sloka Volante, PT 09/19/22 1:16 PM

## 2022-09-20 DIAGNOSIS — K219 Gastro-esophageal reflux disease without esophagitis: Secondary | ICD-10-CM | POA: Diagnosis not present

## 2022-09-20 DIAGNOSIS — R29890 Loss of height: Secondary | ICD-10-CM | POA: Diagnosis not present

## 2022-09-20 DIAGNOSIS — L292 Pruritus vulvae: Secondary | ICD-10-CM | POA: Diagnosis not present

## 2022-09-20 DIAGNOSIS — N958 Other specified menopausal and perimenopausal disorders: Secondary | ICD-10-CM | POA: Diagnosis not present

## 2022-09-20 DIAGNOSIS — M8588 Other specified disorders of bone density and structure, other site: Secondary | ICD-10-CM | POA: Diagnosis not present

## 2022-09-21 ENCOUNTER — Ambulatory Visit: Payer: PPO | Admitting: Physical Therapy

## 2022-09-21 ENCOUNTER — Encounter: Payer: Self-pay | Admitting: Physical Therapy

## 2022-09-21 DIAGNOSIS — M5459 Other low back pain: Secondary | ICD-10-CM

## 2022-09-21 DIAGNOSIS — R252 Cramp and spasm: Secondary | ICD-10-CM

## 2022-09-21 DIAGNOSIS — M79604 Pain in right leg: Secondary | ICD-10-CM

## 2022-09-21 NOTE — Therapy (Signed)
OUTPATIENT PHYSICAL THERAPY TREATMENT   Patient Name: Deborah Adams MRN: [id number] DOB:Dec 26, 1950, 71 y.o., female Today's Date: 09/21/2022   PT End of Session - 09/21/22 1059     Visit Number 5    Date for PT Re-Evaluation 10/18/22    Authorization Type Medicare Advantage    Progress Note Due on Visit 10    PT Start Time 1100    PT Stop Time 1145    PT Time Calculation (min) 45 min    Activity Tolerance Patient tolerated treatment well    Behavior During Therapy WFL for tasks assessed/performed                 Past Medical History:  Diagnosis Date   GERD (gastroesophageal reflux disease)    Hyperlipidemia    Hypertension    Past Surgical History:  Procedure Laterality Date   ABDOMINAL HYSTERECTOMY     BUNIONECTOMY Right    WRIST SURGERY Left    Patient Active Problem List   Diagnosis Date Noted   Thoracic back pain 12/25/2018   HTN (hypertension) 01/12/2011   Hyperlipemia 01/12/2011   GERD (gastroesophageal reflux disease) 01/12/2011   Chronic allergic rhinitis 01/12/2011   Esophageal stricture 01/12/2011   Cystocele 01/12/2011   Stress incontinence, female 01/12/2011    PCP: Lona Kettle, MD  REFERRING PROVIDER: Earnie Larsson, MD  REFERRING DIAG:  Diagnosis  M43.10 (ICD-10-CM) - Spondylolisthesis, site unspecified    Rationale for Evaluation and Treatment: Rehabilitation  THERAPY DIAG:  Other low back pain  Pain in right leg  Cramp and spasm  ONSET DATE: chronic   SUBJECTIVE:                                                                                                                                                                                           SUBJECTIVE STATEMENT: I walked 3x5' yesterday.  By the third one my Rt hip a little bit on the side.  I did better after last appt - no catch in the back.   PERTINENT HISTORY:  HTN  PAIN:  Are you having pain? Yes: NPRS scale: 0/10   Pain location: Low back into Rt LE Pain  description: aching Aggravating factors: sitting long periods, early morning or late in day, being active  Relieving factors: Tylenol, rest/supine  PRECAUTIONS: None  WEIGHT BEARING RESTRICTIONS: No  FALLS:  Has patient fallen in last 6 months? No  LIVING ENVIRONMENT: Lives with: lives with their spouse Lives in: House/apartment Stairs: yes  Has following equipment at home: None  OCCUPATION: retired   PLOF: Independent and Leisure: gardening  PATIENT GOALS: reduce LBP, gardening,  return to walking   NEXT MD VISIT: 09/13/22  OBJECTIVE:   DIAGNOSTIC FINDINGS:  MRI: IMPRESSION: 1. No acute findings or clear explanation for right lower extremity radicular symptoms. 2. Mild asymmetric narrowing of the left lateral recess at L3-4 with possible left L4 nerve root encroachment. 3. Mild spinal stenosis and mild asymmetric right lateral recess narrowing at L4-5 with potential extraforaminal right L4 nerve root encroachment. 4. Possible extraforaminal left L5 nerve root encroachment by disc bulging and facet hypertrophy at L5-S1.    PATIENT SURVEYS:  FOTO 68 (goal is 48)  SCREENING FOR RED FLAGS: Bowel or bladder incontinence: No Spinal tumors: No Cauda equina syndrome: No Compression fracture: No Abdominal aneurysm: No  COGNITION: Overall cognitive status: Within functional limits for tasks assessed     SENSATION: WFL  MUSCLE LENGTH: Rt limited by 30%, Lt limited by 25%  POSTURE: rounded shoulders, forward head, and flexed trunk   PALPATION: Diffuse palpable tenderness over bil lumbar paraspinals and Rt>Lt gluteals.  Significant reduction in spinal mobility with PA mobs in thoracic and lumbar spine with pain L1-4.  Tender over bil SI joints  LUMBAR ROM:   AROM eval  Flexion Full-pain  Extension Limited by 50%-pain  Right lateral flexion Limited by 25%- pain  Left lateral flexion full  Right rotation   Left rotation    (Blank rows = not tested)  LOWER  EXTREMITY ROM:     Rt hip pain with P/ROM Rt hip into flexion.  Bil hip rotation is limited by 25-50%   LOWER EXTREMITY MMT:   4+/5 Rt LE, 5/5 Lt LE  LUMBAR SPECIAL TESTS:  Slump test: NegativeRt is negative    GAIT: 09/21/22: 6' walk test: 1,116 feet  Distance walked: 50 Assistive device utilized: None Level of assistance: Complete Independence Comments: slow mobility with reduce trunk rotation  TODAY'S TREATMENT:               DATE:  09/21/22: 6' walk test: 1,116 feet Seated hip abd yellow loop band x 25 Seated march alt LE yellow loop at thighs x20 Seated hamstring curls 1x10 each LE red loop at ankles  Sit to stand from chair 1x10 LAQ 2.5lb 3x5 Standing diagonal chop and reverse chop 3lb dumbbell x 5 each way with trunk rotation Standing on foam pad march alt LE taps to 2nd step SLS foot on 1st step with glut med activations 10 reps each side Gait training down hallway and back: add trunk rotation with arm swing and glut med activation in closed chain  09/19/22: NuStep: Level 4x 6 minutes-PT present to discuss progress Standing red band row and extension 2x10 LAQ 2.5lb 3x5 each Seated march 2.5lb x20 (pain in bil hip flexors) Seated hip abd yellow loop band x 20 Sit to stand x5, PT demo hip hinge for COG over LE Wall push up x10 Supine lower trunk rotation x 10 Supine pelvic tilt - good Pt demo of exhale with TA indraw coordination today Articulating bridge x10  09/11/22  NuStep: Level 4x 6 minutes-PT present to discuss progress Hamstring stretch seated 2x20 seconds  Supine trunk rotation: 3x20 seconds  Knee to chest: 3x20 seconds Supine Rt piriformis figure 4, 2 positions  2x20 seconds  Pelvic tilt supine, with exhale and TA indraw, VC and demo for form and breath coordination Rt hip flexion isometric with TA indraw supine 5x5" holds Rt hip Addaday assisted STM in Lt SL Standing bil red band row and extension x10 Wall plank 2x10" Wall push  up x 5 Issued HEP  for strength    PATIENT EDUCATION:  Education details: Access Code: [email or web address], walking program (09/11/22) Person educated: Patient Education method: Explanation, Demonstration, and Handouts Education comprehension: verbalized understanding and returned demonstration  HOME EXERCISE PROGRAM: Access Code: [email or web address] URL: [email or web address] Date: 09/21/2022 Prepared by: Venetia Night Leeta Grimme  Exercises - Supine Lower Trunk Rotation  - 3 x daily - 7 x weekly - 1 sets - 3 reps - 20 hold - Hooklying Single Knee to Chest  - 3 x daily - 7 x weekly - 1 sets - 3 reps - 20 hold - Seated Hamstring Stretch  - 3 x daily - 7 x weekly - 1 sets - 3 reps - 20 hold - Supine Figure 4 Piriformis Stretch  - 3 x daily - 7 x weekly - 1 sets - 3 reps - 20 hold - Supine Piriformis Stretch  - 1 x daily - 7 x weekly - 3 reps - 20 hold - Supine Posterior Pelvic Tilt  - 1 x daily - 7 x weekly - 1 sets - 10 reps - Standing Row with Anchored Resistance  - 1 x daily - 7 x weekly - 1 sets - 10 reps - Standing Shoulder Extension with Resistance  - 1 x daily - 7 x weekly - 1 sets - 10 reps - Standing Plank on Wall  - 1 x daily - 7 x weekly - 1 sets - 3 reps - 10 sec hold - Wall Push Up  - 1 x daily - 7 x weekly - 1 sets - 10 reps - Sit to Stand  - 1 x daily - 7 x weekly - 1 sets - 10 reps - Seated Hip Abduction with Resistance  - 1 x daily - 7 x weekly - 3 sets - 10 reps - Standing March  - 1 x daily - 7 x weekly - 1 sets - 20 reps Patient Education - walking program   ASSESSMENT:  CLINICAL IMPRESSION: Pt reported improved tolerance with therex from last visit without pain repercussion.  She walked 3x5' yesterday and noted Rt hip fatigue and soreness on third walk.  She covered 1,116' in 6MWT today and PT noted lack of trunk rotation and some mild trendelenburg on Rt.  Pt is learning to activate gluteals and hip flexors.  She demos improved sit to stand body mechanics today and was able to perform higher reps  before fatigue.  She has some mild/mod pain with glut med standing activations in SLS with foot on step today.  Advanced HEP today and encouraged Pt to continue with walking program.   OBJECTIVE IMPAIRMENTS: decreased activity tolerance, decreased balance, decreased endurance, difficulty walking, decreased ROM, decreased strength, hypomobility, increased muscle spasms, impaired flexibility, improper body mechanics, postural dysfunction, and pain.   ACTIVITY LIMITATIONS: carrying, lifting, sitting, standing, bed mobility, and locomotion level  PARTICIPATION LIMITATIONS: meal prep, cleaning, laundry, shopping, community activity, and yard work  PERSONAL FACTORS: Past/current experiences and 1 comorbidity: time since onset and spinal stensis  are also affecting patient's functional outcome.   REHAB POTENTIAL: Good  CLINICAL DECISION MAKING: Stable/uncomplicated  EVALUATION COMPLEXITY: Low   GOALS: Goals reviewed with patient? Yes  SHORT TERM GOALS: Target date: 09/20/2022  Be independent in initial HEP Baseline: Goal status: met   2.  Report > or = to 30% reduction in LBP and Rt LE pain with ADLs and self-care Baseline: no pain today (09/11/22) Goal status: met  3.  Verbalize and demonstrate body  mechanics modifications for spinal protection with daily tasks  Baseline:  Goal status: met  4.  Initiate a walking program and verbalize safe progression to improve endurance  Baseline: discussed program today (09/11/22) Goal status: met    LONG TERM GOALS: Target date: 10/18/2022  Be independent in advanced HEP Baseline:  Goal status: ongoing  2.  Improve FOTO to > or = to 59 Baseline:  Goal status: INITIAL  3.  Report > or = to 60% reduction in LBP and Rt LE pain with ADLs and self-care Baseline: 2-4/10 Goal status: INITIAL  4.  Return to gardening and verbalize modifications for rest breaks  Baseline:  Goal status: INITIAL  5.  Sit for > or = to 1 hour for longer car  rides without increased LBP and Rt LE pain Baseline:  Goal status: INITIAL    PLAN:  PT FREQUENCY: 2x/week  PT DURATION: 8 weeks  PLANNED INTERVENTIONS: Therapeutic exercises, Therapeutic activity, Neuromuscular re-education, Balance training, Gait training, Patient/Family education, Self Care, Joint mobilization, Joint manipulation, Stair training, Aquatic Therapy, Dry Needling, Electrical stimulation, Spinal manipulation, Spinal mobilization, Taping, Ultrasound, Manual therapy, and Re-evaluation.  PLAN FOR NEXT SESSION: continue hip strength, trunk mobility, functional strength, endurance   Galvin Aversa, PT 09/21/22 11:54 AM

## 2022-09-26 ENCOUNTER — Encounter: Payer: Self-pay | Admitting: Physical Therapy

## 2022-09-26 ENCOUNTER — Ambulatory Visit: Payer: PPO | Admitting: Physical Therapy

## 2022-09-26 DIAGNOSIS — M79604 Pain in right leg: Secondary | ICD-10-CM

## 2022-09-26 DIAGNOSIS — R252 Cramp and spasm: Secondary | ICD-10-CM

## 2022-09-26 DIAGNOSIS — M5459 Other low back pain: Secondary | ICD-10-CM

## 2022-09-26 NOTE — Therapy (Signed)
OUTPATIENT PHYSICAL THERAPY TREATMENT   Patient Name: Deborah Adams MRN: [id number] DOB:Aug 29, 1951, 71 y.o., female Today's Date: 09/26/2022   PT End of Session - 09/26/22 1233     Visit Number 6    Date for PT Re-Evaluation 10/18/22    Authorization Type Medicare Advantage    Progress Note Due on Visit 10    PT Start Time 1233    PT Stop Time 1315    PT Time Calculation (min) 42 min    Activity Tolerance Patient tolerated treatment well    Behavior During Therapy WFL for tasks assessed/performed                  Past Medical History:  Diagnosis Date   GERD (gastroesophageal reflux disease)    Hyperlipidemia    Hypertension    Past Surgical History:  Procedure Laterality Date   ABDOMINAL HYSTERECTOMY     BUNIONECTOMY Right    WRIST SURGERY Left    Patient Active Problem List   Diagnosis Date Noted   Thoracic back pain 12/25/2018   HTN (hypertension) 01/12/2011   Hyperlipemia 01/12/2011   GERD (gastroesophageal reflux disease) 01/12/2011   Chronic allergic rhinitis 01/12/2011   Esophageal stricture 01/12/2011   Cystocele 01/12/2011   Stress incontinence, female 01/12/2011    PCP: Lona Kettle, MD  REFERRING PROVIDER: Earnie Larsson, MD  REFERRING DIAG:  Diagnosis  M43.10 (ICD-10-CM) - Spondylolisthesis, site unspecified    Rationale for Evaluation and Treatment: Rehabilitation  THERAPY DIAG:  Other low back pain  Pain in right leg  Cramp and spasm  ONSET DATE: chronic   SUBJECTIVE:                                                                                                                                                                                           SUBJECTIVE STATEMENT: I am at least 50% better.  I am walking better, getting up and down from chairs better and I really haven't had any pain since last visit.  PERTINENT HISTORY:  HTN  PAIN:  Are you having pain? Yes: NPRS scale: 0/10   Pain location: Low back into Rt  LE Pain description: aching Aggravating factors: sitting long periods, early morning or late in day, being active  Relieving factors: Tylenol, rest/supine  PRECAUTIONS: None  WEIGHT BEARING RESTRICTIONS: No  FALLS:  Has patient fallen in last 6 months? No  LIVING ENVIRONMENT: Lives with: lives with their spouse Lives in: House/apartment Stairs: yes  Has following equipment at home: None  OCCUPATION: retired   PLOF: Independent and Leisure: gardening  PATIENT GOALS: reduce LBP, gardening, return to walking  NEXT MD VISIT: 09/13/22  OBJECTIVE:   DIAGNOSTIC FINDINGS:  MRI: IMPRESSION: 1. No acute findings or clear explanation for right lower extremity radicular symptoms. 2. Mild asymmetric narrowing of the left lateral recess at L3-4 with possible left L4 nerve root encroachment. 3. Mild spinal stenosis and mild asymmetric right lateral recess narrowing at L4-5 with potential extraforaminal right L4 nerve root encroachment. 4. Possible extraforaminal left L5 nerve root encroachment by disc bulging and facet hypertrophy at L5-S1.    PATIENT SURVEYS:  09/26/22: FOTO 56% Eval: FOTO 49 (goal is 74)  SCREENING FOR RED FLAGS: Bowel or bladder incontinence: No Spinal tumors: No Cauda equina syndrome: No Compression fracture: No Abdominal aneurysm: No  COGNITION: Overall cognitive status: Within functional limits for tasks assessed     SENSATION: WFL  MUSCLE LENGTH: Rt limited by 30%, Lt limited by 25%  POSTURE: rounded shoulders, forward head, and flexed trunk   PALPATION: Diffuse palpable tenderness over bil lumbar paraspinals and Rt>Lt gluteals.  Significant reduction in spinal mobility with PA mobs in thoracic and lumbar spine with pain L1-4.  Tender over bil SI joints  LUMBAR ROM:   AROM eval  Flexion Full-pain  Extension Limited by 50%-pain  Right lateral flexion Limited by 25%- pain  Left lateral flexion full  Right rotation   Left rotation     (Blank rows = not tested)  LOWER EXTREMITY ROM:     Rt hip pain with P/ROM Rt hip into flexion.  Bil hip rotation is limited by 25-50%   LOWER EXTREMITY MMT:   4+/5 Rt LE, 5/5 Lt LE  LUMBAR SPECIAL TESTS:  Slump test: NegativeRt is negative    GAIT: 09/21/22: 6' walk test: 1,116 feet  Distance walked: 50 Assistive device utilized: None Level of assistance: Complete Independence Comments: slow mobility with reduce trunk rotation  TODAY'S TREATMENT:               DATE:  09/26/22 4' walk indoor level surface, immediately followed by NuStep L5x 6', PT present to discuss status LAQ 3lb 3x5 Seated hip abd yellow loop band x 25 Seated march alt LE yellow loop at thighs 2x20 Seated hamstring curls 1x15 each LE red loop at ankles  Sit to stand from chair 1x10 no UE assist Standing diagonal chop and reverse chop 3lb dumbbell x 5 each way with trunk rotation Standing forward raise single 3lb dumbbell  x10 Bwd resistance walking 5x5lb, PT close supervision for safety Standing on foam pad march alt LE taps to 2nd step x 1' Standing shoulder ext and row red band x 15 each  09/21/22: 6' walk test: 1,116 feet Seated hip abd yellow loop band x 25 Seated march alt LE yellow loop at thighs x20 Seated hamstring curls 1x10 each LE red loop at ankles  Sit to stand from chair 1x10 LAQ 2.5lb 3x5 Standing diagonal chop and reverse chop 3lb dumbbell x 5 each way with trunk rotation Standing on foam pad march alt LE taps to 2nd step SLS foot on 1st step with glut med activations 10 reps each side Gait training down hallway and back: add trunk rotation with arm swing and glut med activation in closed chain  09/19/22: NuStep: Level 4x 6 minutes-PT present to discuss progress Standing red band row and extension 2x10 LAQ 2.5lb 3x5 each Seated march 2.5lb x20 (pain in bil hip flexors) Seated hip abd yellow loop band x 20 Sit to stand x5, PT demo hip hinge for COG over LE Wall push  up  x10 Supine lower trunk rotation x 10 Supine pelvic tilt - good Pt demo of exhale with TA indraw coordination today Articulating bridge x10   PATIENT EDUCATION:  Education details: Access Code: [email or web address], walking program (09/11/22) Person educated: Patient Education method: Explanation, Demonstration, and Handouts Education comprehension: verbalized understanding and returned demonstration  HOME EXERCISE PROGRAM: Access Code: [email or web address] URL: [email or web address] Date: 09/21/2022 Prepared by: Venetia Night Zohair Epp  Exercises - Supine Lower Trunk Rotation  - 3 x daily - 7 x weekly - 1 sets - 3 reps - 20 hold - Hooklying Single Knee to Chest  - 3 x daily - 7 x weekly - 1 sets - 3 reps - 20 hold - Seated Hamstring Stretch  - 3 x daily - 7 x weekly - 1 sets - 3 reps - 20 hold - Supine Figure 4 Piriformis Stretch  - 3 x daily - 7 x weekly - 1 sets - 3 reps - 20 hold - Supine Piriformis Stretch  - 1 x daily - 7 x weekly - 3 reps - 20 hold - Supine Posterior Pelvic Tilt  - 1 x daily - 7 x weekly - 1 sets - 10 reps - Standing Row with Anchored Resistance  - 1 x daily - 7 x weekly - 1 sets - 10 reps - Standing Shoulder Extension with Resistance  - 1 x daily - 7 x weekly - 1 sets - 10 reps - Standing Plank on Wall  - 1 x daily - 7 x weekly - 1 sets - 3 reps - 10 sec hold - Wall Push Up  - 1 x daily - 7 x weekly - 1 sets - 10 reps - Sit to Stand  - 1 x daily - 7 x weekly - 1 sets - 10 reps - Seated Hip Abduction with Resistance  - 1 x daily - 7 x weekly - 3 sets - 10 reps - Standing March  - 1 x daily - 7 x weekly - 1 sets - 20 reps Patient Education - walking program   ASSESSMENT:  CLINICAL IMPRESSION: Pt reports 50% improvement since starting PT.  She feels she has improved endurance with her walks and feels stronger for functional transfers and activity.  She reported no pain since last visit.  FOTO score improved today to 56% with goal of 59%.  She is tolerating exercise  progression well and demonstrating much improved stability in standing therex.  She needs intermittent VC to "zip up" her core.  OBJECTIVE IMPAIRMENTS: decreased activity tolerance, decreased balance, decreased endurance, difficulty walking, decreased ROM, decreased strength, hypomobility, increased muscle spasms, impaired flexibility, improper body mechanics, postural dysfunction, and pain.   ACTIVITY LIMITATIONS: carrying, lifting, sitting, standing, bed mobility, and locomotion level  PARTICIPATION LIMITATIONS: meal prep, cleaning, laundry, shopping, community activity, and yard work  PERSONAL FACTORS: Past/current experiences and 1 comorbidity: time since onset and spinal stensis  are also affecting patient's functional outcome.   REHAB POTENTIAL: Good  CLINICAL DECISION MAKING: Stable/uncomplicated  EVALUATION COMPLEXITY: Low   GOALS: Goals reviewed with patient? Yes  SHORT TERM GOALS: Target date: 09/20/2022  Be independent in initial HEP Baseline: Goal status: met   2.  Report > or = to 30% reduction in LBP and Rt LE pain with ADLs and self-care Baseline: no pain today (09/11/22) Goal status: met  3.  Verbalize and demonstrate body mechanics modifications for spinal protection with daily tasks  Baseline:  Goal status: met  4.  Initiate a walking program  and verbalize safe progression to improve endurance  Baseline: discussed program today (09/11/22) Goal status: met    LONG TERM GOALS: Target date: 10/18/2022  Be independent in advanced HEP Baseline:  Goal status: ongoing  2.  Improve FOTO to > or = to 59 Baseline:  Goal status:ongoing, 56% on 12/19  3.  Report > or = to 60% reduction in LBP and Rt LE pain with ADLs and self-care Baseline: 2-4/10 Goal status: ongoing 50% improvement 12/19  4.  Return to gardening and verbalize modifications for rest breaks  Baseline:  Goal status: INITIAL  5.  Sit for > or = to 1 hour for longer car rides without  increased LBP and Rt LE pain Baseline:  Goal status: INITIAL    PLAN:  PT FREQUENCY: 2x/week  PT DURATION: 8 weeks  PLANNED INTERVENTIONS: Therapeutic exercises, Therapeutic activity, Neuromuscular re-education, Balance training, Gait training, Patient/Family education, Self Care, Joint mobilization, Joint manipulation, Stair training, Aquatic Therapy, Dry Needling, Electrical stimulation, Spinal manipulation, Spinal mobilization, Taping, Ultrasound, Manual therapy, and Re-evaluation.  PLAN FOR NEXT SESSION: continue hip strength, trunk mobility, functional strength, endurance   Elky Funches, PT 09/26/22 1:20 PM

## 2022-09-28 ENCOUNTER — Encounter: Payer: Self-pay | Admitting: Physical Therapy

## 2022-09-28 ENCOUNTER — Ambulatory Visit: Payer: PPO | Admitting: Physical Therapy

## 2022-09-28 DIAGNOSIS — M79604 Pain in right leg: Secondary | ICD-10-CM

## 2022-09-28 DIAGNOSIS — M5459 Other low back pain: Secondary | ICD-10-CM

## 2022-09-28 DIAGNOSIS — R252 Cramp and spasm: Secondary | ICD-10-CM

## 2022-09-28 NOTE — Therapy (Signed)
OUTPATIENT PHYSICAL THERAPY TREATMENT   Patient Name: Deborah Adams MRN: [id number] DOB:05-09-1951, 71 y.o., female Today's Date: 09/28/2022   PT End of Session - 09/28/22 1056     Visit Number 7    Date for PT Re-Evaluation 10/18/22    Authorization Type Medicare Advantage    Progress Note Due on Visit 10    PT Start Time 1056    PT Stop Time 1134    PT Time Calculation (min) 38 min    Activity Tolerance Patient tolerated treatment well    Behavior During Therapy WFL for tasks assessed/performed                   Past Medical History:  Diagnosis Date   GERD (gastroesophageal reflux disease)    Hyperlipidemia    Hypertension    Past Surgical History:  Procedure Laterality Date   ABDOMINAL HYSTERECTOMY     BUNIONECTOMY Right    WRIST SURGERY Left    Patient Active Problem List   Diagnosis Date Noted   Thoracic back pain 12/25/2018   HTN (hypertension) 01/12/2011   Hyperlipemia 01/12/2011   GERD (gastroesophageal reflux disease) 01/12/2011   Chronic allergic rhinitis 01/12/2011   Esophageal stricture 01/12/2011   Cystocele 01/12/2011   Stress incontinence, female 01/12/2011    PCP: Lona Kettle, MD  REFERRING PROVIDER: Earnie Larsson, MD  REFERRING DIAG:  Diagnosis  M43.10 (ICD-10-CM) - Spondylolisthesis, site unspecified    Rationale for Evaluation and Treatment: Rehabilitation  THERAPY DIAG:  Other low back pain  Pain in right leg  Cramp and spasm  ONSET DATE: chronic   SUBJECTIVE:                                                                                                                                                                                           SUBJECTIVE STATEMENT: I continue to feel much improved.  I went upstairs yesterday at home 2-3 times and stairs always hurt my knees.  My walking is much better and I really don't have much back or hip pain since starting PT.   PERTINENT HISTORY:  HTN  PAIN:  Are you having  pain? Yes: NPRS scale: 0/10   Pain location: Low back into Rt LE Pain description: aching Aggravating factors: sitting long periods, early morning or late in day, being active  Relieving factors: Tylenol, rest/supine  PRECAUTIONS: None  WEIGHT BEARING RESTRICTIONS: No  FALLS:  Has patient fallen in last 6 months? No  LIVING ENVIRONMENT: Lives with: lives with their spouse Lives in: House/apartment Stairs: yes  Has following equipment at home: None  OCCUPATION: retired  PLOF: Independent and Leisure: gardening  PATIENT GOALS: reduce LBP, gardening, return to walking   NEXT MD VISIT: 09/13/22  OBJECTIVE:   DIAGNOSTIC FINDINGS:  MRI: IMPRESSION: 1. No acute findings or clear explanation for right lower extremity radicular symptoms. 2. Mild asymmetric narrowing of the left lateral recess at L3-4 with possible left L4 nerve root encroachment. 3. Mild spinal stenosis and mild asymmetric right lateral recess narrowing at L4-5 with potential extraforaminal right L4 nerve root encroachment. 4. Possible extraforaminal left L5 nerve root encroachment by disc bulging and facet hypertrophy at L5-S1.    PATIENT SURVEYS:  09/26/22: FOTO 56% Eval: FOTO 49 (goal is 68)  SCREENING FOR RED FLAGS: Bowel or bladder incontinence: No Spinal tumors: No Cauda equina syndrome: No Compression fracture: No Abdominal aneurysm: No  COGNITION: Overall cognitive status: Within functional limits for tasks assessed     SENSATION: WFL  MUSCLE LENGTH: Rt limited by 30%, Lt limited by 25%  POSTURE: rounded shoulders, forward head, and flexed trunk   PALPATION: Diffuse palpable tenderness over bil lumbar paraspinals and Rt>Lt gluteals.  Significant reduction in spinal mobility with PA mobs in thoracic and lumbar spine with pain L1-4.  Tender over bil SI joints  LUMBAR ROM:   AROM eval  Flexion Full-pain  Extension Limited by 50%-pain  Right lateral flexion Limited by 25%- pain   Left lateral flexion full  Right rotation   Left rotation    (Blank rows = not tested)  LOWER EXTREMITY ROM:     Rt hip pain with P/ROM Rt hip into flexion.  Bil hip rotation is limited by 25-50%   LOWER EXTREMITY MMT:   4+/5 Rt LE, 5/5 Lt LE  LUMBAR SPECIAL TESTS:  Slump test: NegativeRt is negative    GAIT: 09/21/22: 6' walk test: 1,116 feet  Distance walked: 50 Assistive device utilized: None Level of assistance: Complete Independence Comments: slow mobility with reduce trunk rotation  TODAY'S TREATMENT:               DATE:  09/28/22 NuStep L5 x 6' goal of >60 SPM target pace 3' walk immediately following NuStep to target ongoing endurance training - covered 2 full loops through both gyms Seated hip abd red loop band x 25 Seated march alt LE red loop at thighs 2x20 Seated hamstring curl alt LE red loop band at ankles x 30 LAQ 3lb 1x10, 1x5 (does better with sets of 5 due to chronic Rt knee pain) Sit to stand from chair with 5lb chest press 1x10 Standing diagonal reverse chops with trunk rotation holding single 3lb dumbbell x 10 each way Standing on foam pad march alt LE taps to 2nd step x 30 Bwd resistance walking holding cable pulley handles 8x5lb  09/26/22 4' walk indoor level surface, immediately followed by NuStep L5x 6', PT present to discuss status LAQ 3lb 3x5 Seated hip abd yellow loop band x 25 Seated march alt LE yellow loop at thighs 2x20 Seated hamstring curls 1x15 each LE red loop at ankles  Sit to stand from chair 1x10 no UE assist Standing diagonal chop and reverse chop 3lb dumbbell x 5 each way with trunk rotation Standing forward raise single 3lb dumbbell  x10 Bwd resistance walking 5x5lb, PT close supervision for safety Standing on foam pad march alt LE taps to 2nd step x 1' Standing shoulder ext and row red band x 15 each  09/21/22: 6' walk test: 1,116 feet Seated hip abd yellow loop band x 25 Seated march alt  LE yellow loop at thighs  x20 Seated hamstring curls 1x10 each LE red loop at ankles  Sit to stand from chair 1x10 LAQ 2.5lb 3x5 Standing diagonal chop and reverse chop 3lb dumbbell x 5 each way with trunk rotation Standing on foam pad march alt LE taps to 2nd step SLS foot on 1st step with glut med activations 10 reps each side Gait training down hallway and back: add trunk rotation with arm swing and glut med activation in closed chain  PATIENT EDUCATION:  Education details: Access Code: [email or web address], walking program (09/11/22) Person educated: Patient Education method: Consulting civil engineer, Demonstration, and Handouts Education comprehension: verbalized understanding and returned demonstration  HOME EXERCISE PROGRAM: Access Code: [email or web address] URL: [email or web address] Date: 09/21/2022 Prepared by: Venetia Night Auriella Wieand  Exercises - Supine Lower Trunk Rotation  - 3 x daily - 7 x weekly - 1 sets - 3 reps - 20 hold - Hooklying Single Knee to Chest  - 3 x daily - 7 x weekly - 1 sets - 3 reps - 20 hold - Seated Hamstring Stretch  - 3 x daily - 7 x weekly - 1 sets - 3 reps - 20 hold - Supine Figure 4 Piriformis Stretch  - 3 x daily - 7 x weekly - 1 sets - 3 reps - 20 hold - Supine Piriformis Stretch  - 1 x daily - 7 x weekly - 3 reps - 20 hold - Supine Posterior Pelvic Tilt  - 1 x daily - 7 x weekly - 1 sets - 10 reps - Standing Row with Anchored Resistance  - 1 x daily - 7 x weekly - 1 sets - 10 reps - Standing Shoulder Extension with Resistance  - 1 x daily - 7 x weekly - 1 sets - 10 reps - Standing Plank on Wall  - 1 x daily - 7 x weekly - 1 sets - 3 reps - 10 sec hold - Wall Push Up  - 1 x daily - 7 x weekly - 1 sets - 10 reps - Sit to Stand  - 1 x daily - 7 x weekly - 1 sets - 10 reps - Seated Hip Abduction with Resistance  - 1 x daily - 7 x weekly - 3 sets - 10 reps - Standing March  - 1 x daily - 7 x weekly - 1 sets - 20 reps Patient Education - walking program   ASSESSMENT:  CLINICAL IMPRESSION: Pt  reports 50% improvement since starting PT.  She feels she has improved endurance with her walks and feels stronger for functional transfers and activity.  She reported no LBP or hip pain over past 2 weeks. FOTO score was nearly met last visit.  She is tolerating exercise progression well although does have some chronic Rt knee pain which she feels with LAQ and sit to stand.  She demonstrates much improved stability in standing therex.  She needs intermittent VC to "zip up" her core.  OBJECTIVE IMPAIRMENTS: decreased activity tolerance, decreased balance, decreased endurance, difficulty walking, decreased ROM, decreased strength, hypomobility, increased muscle spasms, impaired flexibility, improper body mechanics, postural dysfunction, and pain.   ACTIVITY LIMITATIONS: carrying, lifting, sitting, standing, bed mobility, and locomotion level  PARTICIPATION LIMITATIONS: meal prep, cleaning, laundry, shopping, community activity, and yard work  PERSONAL FACTORS: Past/current experiences and 1 comorbidity: time since onset and spinal stensis  are also affecting patient's functional outcome.   REHAB POTENTIAL: Good  CLINICAL DECISION MAKING: Stable/uncomplicated  EVALUATION COMPLEXITY: Low   GOALS: Goals reviewed with  patient? Yes  SHORT TERM GOALS: Target date: 09/20/2022  Be independent in initial HEP Baseline: Goal status: met   2.  Report > or = to 30% reduction in LBP and Rt LE pain with ADLs and self-care Baseline: no pain today (09/11/22) Goal status: met  3.  Verbalize and demonstrate body mechanics modifications for spinal protection with daily tasks  Baseline:  Goal status: met  4.  Initiate a walking program and verbalize safe progression to improve endurance  Baseline: discussed program today (09/11/22) Goal status: met    LONG TERM GOALS: Target date: 10/18/2022  Be independent in advanced HEP Baseline:  Goal status: ongoing  2.  Improve FOTO to > or = to  59 Baseline:  Goal status:ongoing, 56% on 12/19  3.  Report > or = to 60% reduction in LBP and Rt LE pain with ADLs and self-care Baseline: 2-4/10 Goal status: ongoing 50% improvement 12/19  4.  Return to gardening and verbalize modifications for rest breaks  Baseline:  Goal status: INITIAL  5.  Sit for > or = to 1 hour for longer car rides without increased LBP and Rt LE pain Baseline:  Goal status: INITIAL    PLAN:  PT FREQUENCY: 2x/week  PT DURATION: 8 weeks  PLANNED INTERVENTIONS: Therapeutic exercises, Therapeutic activity, Neuromuscular re-education, Balance training, Gait training, Patient/Family education, Self Care, Joint mobilization, Joint manipulation, Stair training, Aquatic Therapy, Dry Needling, Electrical stimulation, Spinal manipulation, Spinal mobilization, Taping, Ultrasound, Manual therapy, and Re-evaluation.  PLAN FOR NEXT SESSION: continue hip strength, trunk mobility, functional strength, endurance  Mila Pair, PT 09/28/22 11:35 AM

## 2022-10-03 ENCOUNTER — Ambulatory Visit: Payer: PPO

## 2022-10-10 ENCOUNTER — Encounter: Payer: Self-pay | Admitting: Physical Therapy

## 2022-10-10 ENCOUNTER — Ambulatory Visit: Payer: PPO | Attending: Neurosurgery | Admitting: Physical Therapy

## 2022-10-10 DIAGNOSIS — R252 Cramp and spasm: Secondary | ICD-10-CM | POA: Insufficient documentation

## 2022-10-10 DIAGNOSIS — M5459 Other low back pain: Secondary | ICD-10-CM | POA: Diagnosis not present

## 2022-10-10 DIAGNOSIS — M79604 Pain in right leg: Secondary | ICD-10-CM | POA: Insufficient documentation

## 2022-10-10 NOTE — Therapy (Signed)
OUTPATIENT PHYSICAL THERAPY TREATMENT   Patient Name: Deborah Adams MRN: [id number] DOB:Mar 21, 1951, 72 y.o., female Today's Date: 10/10/2022   PT End of Session - 10/10/22 1236     Visit Number 8    Date for PT Re-Evaluation 10/18/22    Authorization Type Medicare Advantage    Progress Note Due on Visit 10    PT Start Time 1233    PT Stop Time 1315    PT Time Calculation (min) 42 min    Activity Tolerance Patient tolerated treatment well    Behavior During Therapy WFL for tasks assessed/performed                    Past Medical History:  Diagnosis Date   GERD (gastroesophageal reflux disease)    Hyperlipidemia    Hypertension    Past Surgical History:  Procedure Laterality Date   ABDOMINAL HYSTERECTOMY     BUNIONECTOMY Right    WRIST SURGERY Left    Patient Active Problem List   Diagnosis Date Noted   Thoracic back pain 12/25/2018   HTN (hypertension) 01/12/2011   Hyperlipemia 01/12/2011   GERD (gastroesophageal reflux disease) 01/12/2011   Chronic allergic rhinitis 01/12/2011   Esophageal stricture 01/12/2011   Cystocele 01/12/2011   Stress incontinence, female 01/12/2011    PCP: Lona Kettle, MD  REFERRING PROVIDER: Earnie Larsson, MD  REFERRING DIAG:  Diagnosis  M43.10 (ICD-10-CM) - Spondylolisthesis, site unspecified    Rationale for Evaluation and Treatment: Rehabilitation  THERAPY DIAG:  Other low back pain  Pain in right leg  Cramp and spasm  ONSET DATE: chronic   SUBJECTIVE:                                                                                                                                                                                           SUBJECTIVE STATEMENT: I had an episode of Rt groin pain, very acute, after walking or turning just the wrong way the day after last visit.  I have been very careful and haven't done much and it is mostly all the way better.  I missed PT last week b/c I was in too much pain.   I  was able to ride in the car for 1 hour before pain started on our way out of town.    PERTINENT HISTORY:  HTN  PAIN:  Are you having pain? Yes: NPRS scale: 0/10   Pain location: Low back into Rt LE Pain description: aching Aggravating factors: sitting long periods, early morning or late in day, being active  Relieving factors: Tylenol, rest/supine  PRECAUTIONS: None  WEIGHT BEARING RESTRICTIONS:  No  FALLS:  Has patient fallen in last 6 months? No  LIVING ENVIRONMENT: Lives with: lives with their spouse Lives in: House/apartment Stairs: yes  Has following equipment at home: None  OCCUPATION: retired   PLOF: Independent and Leisure: gardening  PATIENT GOALS: reduce LBP, gardening, return to walking   NEXT MD VISIT: 09/13/22  OBJECTIVE:   DIAGNOSTIC FINDINGS:  MRI: IMPRESSION: 1. No acute findings or clear explanation for right lower extremity radicular symptoms. 2. Mild asymmetric narrowing of the left lateral recess at L3-4 with possible left L4 nerve root encroachment. 3. Mild spinal stenosis and mild asymmetric right lateral recess narrowing at L4-5 with potential extraforaminal right L4 nerve root encroachment. 4. Possible extraforaminal left L5 nerve root encroachment by disc bulging and facet hypertrophy at L5-S1.    PATIENT SURVEYS:  09/26/22: FOTO 56% Eval: FOTO 49 (goal is 13)  SCREENING FOR RED FLAGS: Bowel or bladder incontinence: No Spinal tumors: No Cauda equina syndrome: No Compression fracture: No Abdominal aneurysm: No  COGNITION: Overall cognitive status: Within functional limits for tasks assessed     SENSATION: WFL  MUSCLE LENGTH: Rt limited by 30%, Lt limited by 25%  POSTURE: rounded shoulders, forward head, and flexed trunk   PALPATION: Diffuse palpable tenderness over bil lumbar paraspinals and Rt>Lt gluteals.  Significant reduction in spinal mobility with PA mobs in thoracic and lumbar spine with pain L1-4.  Tender over bil SI  joints  LUMBAR ROM:   AROM eval  Flexion Full-pain  Extension Limited by 50%-pain  Right lateral flexion Limited by 25%- pain  Left lateral flexion full  Right rotation   Left rotation    (Blank rows = not tested)  LOWER EXTREMITY ROM:     Rt hip pain with P/ROM Rt hip into flexion.  Bil hip rotation is limited by 25-50%   LOWER EXTREMITY MMT:   4+/5 Rt LE, 5/5 Lt LE  LUMBAR SPECIAL TESTS:  Slump test: NegativeRt is negative    GAIT: 09/21/22: 6' walk test: 1,116 feet  Distance walked: 50 Assistive device utilized: None Level of assistance: Complete Independence Comments: slow mobility with reduce trunk rotation  TODAY'S TREATMENT:               DATE:  10/10/22: NuStep L4 x 7' PT present to discuss status Supine lower trunk rotation 3x10" bil Supine HS stretch, dynamic x 10 slow kicks bil Supine hooklying hip abd 1x10 red loop Supine bridge with hip abd red loop 1x10 Sit to stand from mat table 1x10, 1x5 with 5lb kbell chest press Standing diagonal reverse chops with trunk rotation holding single 3lb dumbbell 2x5 each way Bwd resistance walking holding cable pulley handles 8x5lb Seated HS curl red loop 2x10 bil Standing march tap to 6" step alt LE x 10  09/28/22 NuStep L5 x 6' goal of >60 SPM target pace 3' walk immediately following NuStep to target ongoing endurance training - covered 2 full loops through both gyms Seated hip abd red loop band x 25 Seated march alt LE red loop at thighs 2x20 Seated hamstring curl alt LE red loop band at ankles x 30 LAQ 3lb 1x10, 1x5 (does better with sets of 5 due to chronic Rt knee pain) Sit to stand from chair with 5lb chest press 1x10 Standing diagonal reverse chops with trunk rotation holding single 3lb dumbbell x 10 each way Standing on foam pad march alt LE taps to 2nd step x 30 Bwd resistance walking holding cable pulley handles 8x5lb  09/26/22 4' walk indoor level surface, immediately followed by NuStep L5x 6', PT  present to discuss status LAQ 3lb 3x5 Seated hip abd yellow loop band x 25 Seated march alt LE yellow loop at thighs 2x20 Seated hamstring curls 1x15 each LE red loop at ankles  Sit to stand from chair 1x10 no UE assist Standing diagonal chop and reverse chop 3lb dumbbell x 5 each way with trunk rotation Standing forward raise single 3lb dumbbell  x10 Bwd resistance walking 5x5lb, PT close supervision for safety Standing on foam pad march alt LE taps to 2nd step x 1' Standing shoulder ext and row red band x 15 each   PATIENT EDUCATION:  Education details: Access Code: [email or web address], walking program (09/11/22) Person educated: Patient Education method: Consulting civil engineer, Demonstration, and Handouts Education comprehension: verbalized understanding and returned demonstration  HOME EXERCISE PROGRAM: Access Code: [email or web address] URL: [email or web address] Date: 09/21/2022 Prepared by: Venetia Night Jaleya Pebley  Exercises - Supine Lower Trunk Rotation  - 3 x daily - 7 x weekly - 1 sets - 3 reps - 20 hold - Hooklying Single Knee to Chest  - 3 x daily - 7 x weekly - 1 sets - 3 reps - 20 hold - Seated Hamstring Stretch  - 3 x daily - 7 x weekly - 1 sets - 3 reps - 20 hold - Supine Figure 4 Piriformis Stretch  - 3 x daily - 7 x weekly - 1 sets - 3 reps - 20 hold - Supine Piriformis Stretch  - 1 x daily - 7 x weekly - 3 reps - 20 hold - Supine Posterior Pelvic Tilt  - 1 x daily - 7 x weekly - 1 sets - 10 reps - Standing Row with Anchored Resistance  - 1 x daily - 7 x weekly - 1 sets - 10 reps - Standing Shoulder Extension with Resistance  - 1 x daily - 7 x weekly - 1 sets - 10 reps - Standing Plank on Wall  - 1 x daily - 7 x weekly - 1 sets - 3 reps - 10 sec hold - Wall Push Up  - 1 x daily - 7 x weekly - 1 sets - 10 reps - Sit to Stand  - 1 x daily - 7 x weekly - 1 sets - 10 reps - Seated Hip Abduction with Resistance  - 1 x daily - 7 x weekly - 3 sets - 10 reps - Standing March  - 1 x daily - 7 x  weekly - 1 sets - 20 reps Patient Education - walking program   ASSESSMENT:  CLINICAL IMPRESSION: Pt had a set back with acute Rt groin pain after moving just the wrong way.  It has taken about 10 days to calm down but she is still moving cautiously with fear it will return.  She was able to perform gentle mobility and therex today with improvement in mobility and pain although got fatigued by end of session.  Discussed trying to avoid turning with Rt foot planted.  Pt is concerned over this set back and depending on how she rebounds she may benefit from extension of PT which we will decide next visit.    OBJECTIVE IMPAIRMENTS: decreased activity tolerance, decreased balance, decreased endurance, difficulty walking, decreased ROM, decreased strength, hypomobility, increased muscle spasms, impaired flexibility, improper body mechanics, postural dysfunction, and pain.   ACTIVITY LIMITATIONS: carrying, lifting, sitting, standing, bed mobility, and locomotion level  PARTICIPATION LIMITATIONS: meal prep, cleaning, laundry, shopping, community activity, and yard  work  PERSONAL FACTORS: Past/current experiences and 1 comorbidity: time since onset and spinal stensis  are also affecting patient's functional outcome.   REHAB POTENTIAL: Good  CLINICAL DECISION MAKING: Stable/uncomplicated  EVALUATION COMPLEXITY: Low   GOALS: Goals reviewed with patient? Yes  SHORT TERM GOALS: Target date: 09/20/2022  Be independent in initial HEP Baseline: Goal status: met   2.  Report > or = to 30% reduction in LBP and Rt LE pain with ADLs and self-care Baseline: no pain today (09/11/22) Goal status: met  3.  Verbalize and demonstrate body mechanics modifications for spinal protection with daily tasks  Baseline:  Goal status: met  4.  Initiate a walking program and verbalize safe progression to improve endurance  Baseline: discussed program today (09/11/22) Goal status: met    LONG TERM GOALS:  Target date: 10/18/2022  Be independent in advanced HEP Baseline:  Goal status: ongoing  2.  Improve FOTO to > or = to 59 Baseline:  Goal status:ongoing, 56% on 12/19  3.  Report > or = to 60% reduction in LBP and Rt LE pain with ADLs and self-care Baseline: 2-4/10 Goal status: ongoing 50% improvement 12/19  4.  Return to gardening and verbalize modifications for rest breaks  Baseline:  Goal status: INITIAL  5.  Sit for > or = to 1 hour for longer car rides without increased LBP and Rt LE pain Baseline:  Goal status: INITIAL    PLAN:  PT FREQUENCY: 2x/week  PT DURATION: 8 weeks  PLANNED INTERVENTIONS: Therapeutic exercises, Therapeutic activity, Neuromuscular re-education, Balance training, Gait training, Patient/Family education, Self Care, Joint mobilization, Joint manipulation, Stair training, Aquatic Therapy, Dry Needling, Electrical stimulation, Spinal manipulation, Spinal mobilization, Taping, Ultrasound, Manual therapy, and Re-evaluation.  PLAN FOR NEXT SESSION: continue hip strength, trunk mobility, functional strength, endurance  Cleopha Indelicato, PT 10/10/22 1:17 PM

## 2022-10-12 ENCOUNTER — Ambulatory Visit: Payer: PPO | Admitting: Physical Therapy

## 2022-10-12 ENCOUNTER — Encounter: Payer: Self-pay | Admitting: Physical Therapy

## 2022-10-12 DIAGNOSIS — R252 Cramp and spasm: Secondary | ICD-10-CM

## 2022-10-12 DIAGNOSIS — M5459 Other low back pain: Secondary | ICD-10-CM | POA: Diagnosis not present

## 2022-10-12 DIAGNOSIS — M4310 Spondylolisthesis, site unspecified: Secondary | ICD-10-CM | POA: Diagnosis not present

## 2022-10-12 DIAGNOSIS — M79604 Pain in right leg: Secondary | ICD-10-CM

## 2022-10-12 NOTE — Therapy (Signed)
OUTPATIENT PHYSICAL THERAPY TREATMENT   Progress Note Reporting Period 08/23/22 to 10/12/22  See note below for Objective Data and Assessment of Progress/Goals.     Patient Name: Deborah Adams MRN: [id number] DOB:1951/04/23, 72 y.o., female Today's Date: 10/12/2022   PT End of Session - 10/12/22 1149     Visit Number 9    Date for PT Re-Evaluation 10/18/22    Authorization Type Medicare Advantage    Progress Note Due on Visit 50    PT Start Time 1147    PT Stop Time 1228    PT Time Calculation (min) 41 min    Activity Tolerance Patient tolerated treatment well    Behavior During Therapy [facility] for tasks assessed/performed                     Past Medical History:  Diagnosis Date   GERD (gastroesophageal reflux disease)    Hyperlipidemia    Hypertension    Past Surgical History:  Procedure Laterality Date   ABDOMINAL HYSTERECTOMY     BUNIONECTOMY Right    WRIST SURGERY Left    Patient Active Problem List   Diagnosis Date Noted   Thoracic back pain 12/25/2018   HTN (hypertension) 01/12/2011   Hyperlipemia 01/12/2011   GERD (gastroesophageal reflux disease) 01/12/2011   Chronic allergic rhinitis 01/12/2011   Esophageal stricture 01/12/2011   Cystocele 01/12/2011   Stress incontinence, female 01/12/2011    PCP: Lona Kettle, MD  REFERRING PROVIDER: Earnie Larsson, MD  REFERRING DIAG:  Diagnosis  M43.10 (ICD-10-CM) - Spondylolisthesis, site unspecified    Rationale for Evaluation and Treatment: Rehabilitation  THERAPY DIAG:  Other low back pain  Pain in right leg  Cramp and spasm  ONSET DATE: chronic   SUBJECTIVE:                                                                                                                                                                                           SUBJECTIVE STATEMENT: I am 75% improved overall.  I can walk more, I have less pain, I can ride in car longer before pain, I can stand to do  housework longer, I can lift my groceries and transfer them inside from car.  The stairs still bother me but that is my knees not my back.  I am faster and more efficient in my transfers and household mobility.   The flare up of my hip is much better since being here last time.    PERTINENT HISTORY:  HTN  PAIN:  Are you having pain? Yes: NPRS scale: 0/10   Pain location: Low back into  Rt LE Pain description: aching Aggravating factors: riding in car with bumpy roads Relieving factors: Tylenol, rest/supine  PRECAUTIONS: None  WEIGHT BEARING RESTRICTIONS: No  FALLS:  Has patient fallen in last 6 months? No  LIVING ENVIRONMENT: Lives with: lives with their spouse Lives in: House/apartment Stairs: yes  Has following equipment at home: None  OCCUPATION: retired   PLOF: Independent and Leisure: gardening  PATIENT GOALS: reduce LBP, gardening, return to walking   NEXT MD VISIT: 09/13/22  OBJECTIVE:   DIAGNOSTIC FINDINGS:  MRI: IMPRESSION: 1. No acute findings or clear explanation for right lower extremity radicular symptoms. 2. Mild asymmetric narrowing of the left lateral recess at L3-4 with possible left L4 nerve root encroachment. 3. Mild spinal stenosis and mild asymmetric right lateral recess narrowing at L4-5 with potential extraforaminal right L4 nerve root encroachment. 4. Possible extraforaminal left L5 nerve root encroachment by disc bulging and facet hypertrophy at L5-S1.    PATIENT SURVEYS:  10/12/22: FOTO 63%, goal met 09/26/22: FOTO 56% Eval: FOTO 49 (goal is 59)  SCREENING FOR RED FLAGS: Bowel or bladder incontinence: No Spinal tumors: No Cauda equina syndrome: No Compression fracture: No Abdominal aneurysm: No  COGNITION: Overall cognitive status: Within functional limits for tasks assessed     SENSATION: WFL  MUSCLE LENGTH: Rt limited by 30%, Lt limited by 25%  POSTURE: rounded shoulders, forward head, and flexed trunk    PALPATION: Diffuse palpable tenderness over bil lumbar paraspinals and Rt>Lt gluteals.  Significant reduction in spinal mobility with PA mobs in thoracic and lumbar spine with pain L1-4.  Tender over bil SI joints  LUMBAR ROM:   AROM eval 10/12/22  Flexion Full-pain Full no pain  Extension Limited by 50%-pain Full no pain  Right lateral flexion Limited by 25%- pain Full no pain  Left lateral flexion full Full no pain  Right rotation  75%  Left rotation  50%   (Blank rows = not tested)  LOWER EXTREMITY ROM:      10/12/22: Rt hip stiff compared to Lt into flexion, ER and IR, limited by 25% with some muscle guarding due to recent acute flare up which is now nearly fully recovered Eval: Rt hip pain with P/ROM Rt hip into flexion.  Bil hip rotation is limited by 25-50%   LOWER EXTREMITY MMT:   10/12/22: Rt hip flexion and hamstring 4+/5, 5/5 hip abd and quad Eval: 4+/5 Rt LE, 5/5 Lt LE  LUMBAR SPECIAL TESTS:  Slump test: NegativeRt is negative    GAIT: 10/12/22: 6' walk test: 1,110 feet - Rt hip fatigue and pain by last 30" 09/21/22: 6' walk test: 1,116 feet  Distance walked: 50 Assistive device utilized: None Level of assistance: Complete Independence Comments: improved arm swing, pelvic rotation, lacks trunk rotation, slight Trendelenburg Lt hip  TODAY'S TREATMENT:               DATE:  10/12/22: NuStep L4 x 7' PT present to review goals FOTO, objective measures (see above) 6' walk test: 1,110' with Rt hip pain by last 30" - improved pelvic rotation and arm swing, still lacks trunk rotation and has slight + Trendelenburg on Lt Supine lower trunk rotation x 20 Supine HS stretch, dynamic x 10 slow kicks bil Rt hip fig 4 and piriformis pull across stretch 3x10" each Supine bridge x 10  10/10/22: NuStep L4 x 7' PT present to discuss status Supine lower trunk rotation 3x10" bil Supine HS stretch, dynamic x 10 slow kicks bil Supine hooklying  hip abd 1x10 red loop Supine bridge with  hip abd red loop 1x10 Sit to stand from mat table 1x10, 1x5 with 5lb kbell chest press Standing diagonal reverse chops with trunk rotation holding single 3lb dumbbell 2x5 each way Bwd resistance walking holding cable pulley handles 8x5lb Seated HS curl red loop 2x10 bil Standing march tap to 6" step alt LE x 10  09/28/22 NuStep L5 x 6' goal of >60 SPM target pace 3' walk immediately following NuStep to target ongoing endurance training - covered 2 full loops through both gyms Seated hip abd red loop band x 25 Seated march alt LE red loop at thighs 2x20 Seated hamstring curl alt LE red loop band at ankles x 30 LAQ 3lb 1x10, 1x5 (does better with sets of 5 due to chronic Rt knee pain) Sit to stand from chair with 5lb chest press 1x10 Standing diagonal reverse chops with trunk rotation holding single 3lb dumbbell x 10 each way Standing on foam pad march alt LE taps to 2nd step x 30 Bwd resistance walking holding cable pulley handles 8x5lb   PATIENT EDUCATION:  Education details: Access Code: [email or web address], walking program (09/11/22) Person educated: Patient Education method: Consulting civil engineer, Demonstration, and Handouts Education comprehension: verbalized understanding and returned demonstration  HOME EXERCISE PROGRAM: Access Code: [email or web address] URL: [email or web address] Date: 09/21/2022 Prepared by: Venetia Night Farrell Pantaleo  Exercises - Supine Lower Trunk Rotation  - 3 x daily - 7 x weekly - 1 sets - 3 reps - 20 hold - Hooklying Single Knee to Chest  - 3 x daily - 7 x weekly - 1 sets - 3 reps - 20 hold - Seated Hamstring Stretch  - 3 x daily - 7 x weekly - 1 sets - 3 reps - 20 hold - Supine Figure 4 Piriformis Stretch  - 3 x daily - 7 x weekly - 1 sets - 3 reps - 20 hold - Supine Piriformis Stretch  - 1 x daily - 7 x weekly - 3 reps - 20 hold - Supine Posterior Pelvic Tilt  - 1 x daily - 7 x weekly - 1 sets - 10 reps - Standing Row with Anchored Resistance  - 1 x daily - 7 x weekly - 1  sets - 10 reps - Standing Shoulder Extension with Resistance  - 1 x daily - 7 x weekly - 1 sets - 10 reps - Standing Plank on Wall  - 1 x daily - 7 x weekly - 1 sets - 3 reps - 10 sec hold - Wall Push Up  - 1 x daily - 7 x weekly - 1 sets - 10 reps - Sit to Stand  - 1 x daily - 7 x weekly - 1 sets - 10 reps - Seated Hip Abduction with Resistance  - 1 x daily - 7 x weekly - 3 sets - 10 reps - Standing March  - 1 x daily - 7 x weekly - 1 sets - 20 reps Patient Education - walking program   ASSESSMENT:  CLINICAL IMPRESSION: Pt with resolution of Rt hip/groin pain from set back over the holidays.  She reports 75% improvement in sitting, standing/walking, strength, and functional task performance around the house.  She has greater trunk flexibility with less pain.  She has ongoing tightness in bil hamstrings, trunk rotation bil, and Rt hip flexion and rotation.  She has much improved hip strength in abductors and quads but needs ongoing strengthening targeting hip flexion and knee flexion.  She is able  to participate in 6' walk test covering 1,110 feet today with onset of Rt hip pain in final minute.  She exceeded FOTO goal today.  She will continue to benefit from skilled PT to maximize mobility, ROM and strength for optimal daily task performance.  OBJECTIVE IMPAIRMENTS: decreased activity tolerance, decreased balance, decreased endurance, difficulty walking, decreased ROM, decreased strength, hypomobility, increased muscle spasms, impaired flexibility, improper body mechanics, postural dysfunction, and pain.   ACTIVITY LIMITATIONS: carrying, lifting, sitting, standing, bed mobility, and locomotion level  PARTICIPATION LIMITATIONS: meal prep, cleaning, laundry, shopping, community activity, and yard work  PERSONAL FACTORS: Past/current experiences and 1 comorbidity: time since onset and spinal stensis  are also affecting patient's functional outcome.   REHAB POTENTIAL: Good  CLINICAL DECISION  MAKING: Stable/uncomplicated  EVALUATION COMPLEXITY: Low   GOALS: Goals reviewed with patient? Yes  SHORT TERM GOALS: Target date: 09/20/2022  Be independent in initial HEP Baseline: Goal status: met   2.  Report > or = to 30% reduction in LBP and Rt LE pain with ADLs and self-care Baseline: no pain today (09/11/22) Goal status: met  3.  Verbalize and demonstrate body mechanics modifications for spinal protection with daily tasks  Baseline:  Goal status: met  4.  Initiate a walking program and verbalize safe progression to improve endurance  Baseline: discussed program today (09/11/22) Goal status: met    LONG TERM GOALS: Target date: 11/16/22  Be independent in advanced HEP Baseline:  Goal status: ongoing  2.  Improve FOTO to > or = to 59 Baseline:  Goal status:ongoing, 56% on 12/19  3.  Report > or = to 60% reduction in LBP and Rt LE pain with ADLs and self-care Baseline: 2-4/10 Goal status: met  4.  Return to gardening and verbalize modifications for rest breaks  Baseline:  Goal status: deferred due to winter, no gardening now  5.  Sit for > or = to 1 hour for longer car rides without increased LBP and Rt LE pain Baseline:  Goal status: ongoing, 45 min over Christmas    PLAN:  PT FREQUENCY: 2x/week  PT DURATION: 5 weeks  PLANNED INTERVENTIONS: Therapeutic exercises, Therapeutic activity, Neuromuscular re-education, Balance training, Gait training, Patient/Family education, Self Care, Joint mobilization, Joint manipulation, Stair training, Aquatic Therapy, Dry Needling, Electrical stimulation, Spinal manipulation, Spinal mobilization, Taping, Ultrasound, Manual therapy, and Re-evaluation.  PLAN FOR NEXT SESSION: continue hip strength, trunk mobility, functional strength, endurance  Kainon Varady, PT 10/12/22 1:35 PM

## 2022-10-16 ENCOUNTER — Ambulatory Visit: Payer: PPO

## 2022-10-16 DIAGNOSIS — M5459 Other low back pain: Secondary | ICD-10-CM | POA: Diagnosis not present

## 2022-10-16 DIAGNOSIS — R252 Cramp and spasm: Secondary | ICD-10-CM

## 2022-10-16 DIAGNOSIS — M79604 Pain in right leg: Secondary | ICD-10-CM

## 2022-10-16 NOTE — Therapy (Signed)
OUTPATIENT PHYSICAL THERAPY TREATMENT       Patient Name: Deborah Adams MRN: [id number] DOB:1951/01/13, 72 y.o., female Today's Date: 10/16/2022   PT End of Session - 10/16/22 1309     Visit Number 10    Date for PT Re-Evaluation 11/16/22    Authorization Type Medicare Advantage    Progress Note Due on Visit 55    PT Start Time 1231    PT Stop Time 1309    PT Time Calculation (min) 38 min    Activity Tolerance Patient tolerated treatment well    Behavior During Therapy WFL for tasks assessed/performed                      Past Medical History:  Diagnosis Date   GERD (gastroesophageal reflux disease)    Hyperlipidemia    Hypertension    Past Surgical History:  Procedure Laterality Date   ABDOMINAL HYSTERECTOMY     BUNIONECTOMY Right    WRIST SURGERY Left    Patient Active Problem List   Diagnosis Date Noted   Thoracic back pain 12/25/2018   HTN (hypertension) 01/12/2011   Hyperlipemia 01/12/2011   GERD (gastroesophageal reflux disease) 01/12/2011   Chronic allergic rhinitis 01/12/2011   Esophageal stricture 01/12/2011   Cystocele 01/12/2011   Stress incontinence, female 01/12/2011    PCP: Lona Kettle, MD  REFERRING PROVIDER: Earnie Larsson, MD  REFERRING DIAG:  Diagnosis  M43.10 (ICD-10-CM) - Spondylolisthesis, site unspecified    Rationale for Evaluation and Treatment: Rehabilitation  THERAPY DIAG:  Other low back pain  Pain in right leg  Cramp and spasm  ONSET DATE: chronic   SUBJECTIVE:                                                                                                                                                                                           SUBJECTIVE STATEMENT: I am having Rt knee pain today.    PERTINENT HISTORY:  HTN  PAIN:  Are you having pain? Yes: NPRS scale: 0/10   Pain location: Low back into Rt LE Pain description: aching Aggravating factors: riding in car with bumpy roads Relieving  factors: Tylenol, rest/supine  PRECAUTIONS: None  WEIGHT BEARING RESTRICTIONS: No  FALLS:  Has patient fallen in last 6 months? No  LIVING ENVIRONMENT: Lives with: lives with their spouse Lives in: House/apartment Stairs: yes  Has following equipment at home: None  OCCUPATION: retired   PLOF: Independent and Leisure: gardening  PATIENT GOALS: reduce LBP, gardening, return to walking   NEXT MD VISIT: 09/13/22  OBJECTIVE:   DIAGNOSTIC FINDINGS:  MRI: IMPRESSION: 1. No  acute findings or clear explanation for right lower extremity radicular symptoms. 2. Mild asymmetric narrowing of the left lateral recess at L3-4 with possible left L4 nerve root encroachment. 3. Mild spinal stenosis and mild asymmetric right lateral recess narrowing at L4-5 with potential extraforaminal right L4 nerve root encroachment. 4. Possible extraforaminal left L5 nerve root encroachment by disc bulging and facet hypertrophy at L5-S1.    PATIENT SURVEYS:  10/12/22: FOTO 63%, goal met 09/26/22: FOTO 56% Eval: FOTO 49 (goal is 59)  SCREENING FOR RED FLAGS: Bowel or bladder incontinence: No Spinal tumors: No Cauda equina syndrome: No Compression fracture: No Abdominal aneurysm: No  COGNITION: Overall cognitive status: Within functional limits for tasks assessed     SENSATION: WFL  MUSCLE LENGTH: Rt limited by 30%, Lt limited by 25%  POSTURE: rounded shoulders, forward head, and flexed trunk   PALPATION: Diffuse palpable tenderness over bil lumbar paraspinals and Rt>Lt gluteals.  Significant reduction in spinal mobility with PA mobs in thoracic and lumbar spine with pain L1-4.  Tender over bil SI joints  LUMBAR ROM:   AROM eval 10/12/22  Flexion Full-pain Full no pain  Extension Limited by 50%-pain Full no pain  Right lateral flexion Limited by 25%- pain Full no pain  Left lateral flexion full Full no pain  Right rotation  75%  Left rotation  50%   (Blank rows = not tested)  LOWER  EXTREMITY ROM:      10/12/22: Rt hip stiff compared to Lt into flexion, ER and IR, limited by 25% with some muscle guarding due to recent acute flare up which is now nearly fully recovered Eval: Rt hip pain with P/ROM Rt hip into flexion.  Bil hip rotation is limited by 25-50%   LOWER EXTREMITY MMT:   10/12/22: Rt hip flexion and hamstring 4+/5, 5/5 hip abd and quad Eval: 4+/5 Rt LE, 5/5 Lt LE  LUMBAR SPECIAL TESTS:  Slump test: NegativeRt is negative   GAIT: 10/12/22: 6' walk test: 1,110 feet - Rt hip fatigue and pain by last 30" 09/21/22: 6' walk test: 1,116 feet  Distance walked: 50 Assistive device utilized: None Level of assistance: Complete Independence Comments: improved arm swing, pelvic rotation, lacks trunk rotation, slight Trendelenburg Lt hip  TODAY'S TREATMENT:        10/16/22: NuStep L4 x 8' PT present to review goals Supine lower trunk rotation x 20 Supine HS stretch, dynamic x 10 slow kicks bil Rt hip fig 4 and piriformis seated 3x20 seconds  Supine bridge 2 x 10   Sit to stand from mat table 1x15 with 5lb kbell chest press Pelvic tilt with ball squeeze in supine: 5" hold x10 Supine hooklying hip abd 1x15- green theraband loop        10/12/22: NuStep L4 x 7' PT present to review goals FOTO, objective measures (see above) 6' walk test: 1,110' with Rt hip pain by last 30" - improved pelvic rotation and arm swing, still lacks trunk rotation and has slight + Trendelenburg on Lt Supine lower trunk rotation x 20 Supine HS stretch, dynamic x 10 slow kicks bil Rt hip fig 4 and piriformis pull across stretch 3x10" each Supine bridge x 10   10/10/22: NuStep L4 x 7' PT present to discuss status Supine lower trunk rotation 3x10" bil Supine HS stretch, dynamic x 10 slow kicks bil Supine hooklying hip abd 1x10 red loop Supine bridge with hip abd red loop 1x10 Sit to stand from mat table 1x10, 1x5 with 5lb  kbell chest press Standing diagonal reverse chops with trunk rotation  holding single 3lb dumbbell 2x5 each way Bwd resistance walking holding cable pulley handles 8x5lb Seated HS curl red loop 2x10 bil Standing march tap to 6" step alt LE x 10   PATIENT EDUCATION:  Education details: Access Code: [email or web address], walking program (09/11/22) Person educated: Patient Education method: Explanation, Demonstration, and Handouts Education comprehension: verbalized understanding and returned demonstration  HOME EXERCISE PROGRAM: Access Code: [email or web address] URL: [email or web address] Date: 09/21/2022 Prepared by: Venetia Night Beuhring  Exercises - Supine Lower Trunk Rotation  - 3 x daily - 7 x weekly - 1 sets - 3 reps - 20 hold - Hooklying Single Knee to Chest  - 3 x daily - 7 x weekly - 1 sets - 3 reps - 20 hold - Seated Hamstring Stretch  - 3 x daily - 7 x weekly - 1 sets - 3 reps - 20 hold - Supine Figure 4 Piriformis Stretch  - 3 x daily - 7 x weekly - 1 sets - 3 reps - 20 hold - Supine Piriformis Stretch  - 1 x daily - 7 x weekly - 3 reps - 20 hold - Supine Posterior Pelvic Tilt  - 1 x daily - 7 x weekly - 1 sets - 10 reps - Standing Row with Anchored Resistance  - 1 x daily - 7 x weekly - 1 sets - 10 reps - Standing Shoulder Extension with Resistance  - 1 x daily - 7 x weekly - 1 sets - 10 reps - Standing Plank on Wall  - 1 x daily - 7 x weekly - 1 sets - 3 reps - 10 sec hold - Wall Push Up  - 1 x daily - 7 x weekly - 1 sets - 10 reps - Sit to Stand  - 1 x daily - 7 x weekly - 1 sets - 10 reps - Seated Hip Abduction with Resistance  - 1 x daily - 7 x weekly - 3 sets - 10 reps - Standing March  - 1 x daily - 7 x weekly - 1 sets - 20 reps Patient Education - walking program   ASSESSMENT:  CLINICAL IMPRESSION: Pt continues to report 75% overall improvement in symptoms since the start of care. Pt with some Rt knee pain today that is typical.  Rt groin pain has improved since flare up 2 weeks ago.  Pt did well with exercises in the clinic and PT monitored for  technique and pain throughout session.  She will continue to benefit from skilled PT to maximize mobility, ROM and strength for optimal daily task performance.  OBJECTIVE IMPAIRMENTS: decreased activity tolerance, decreased balance, decreased endurance, difficulty walking, decreased ROM, decreased strength, hypomobility, increased muscle spasms, impaired flexibility, improper body mechanics, postural dysfunction, and pain.   ACTIVITY LIMITATIONS: carrying, lifting, sitting, standing, bed mobility, and locomotion level  PARTICIPATION LIMITATIONS: meal prep, cleaning, laundry, shopping, community activity, and yard work  PERSONAL FACTORS: Past/current experiences and 1 comorbidity: time since onset and spinal stensis  are also affecting patient's functional outcome.   REHAB POTENTIAL: Good  CLINICAL DECISION MAKING: Stable/uncomplicated  EVALUATION COMPLEXITY: Low   GOALS: Goals reviewed with patient? Yes  SHORT TERM GOALS: Target date: 09/20/2022  Be independent in initial HEP Baseline: Goal status: met   2.  Report > or = to 30% reduction in LBP and Rt LE pain with ADLs and self-care Baseline: no pain today (09/11/22) Goal status: met  3.  Verbalize and demonstrate  body mechanics modifications for spinal protection with daily tasks  Baseline:  Goal status: met  4.  Initiate a walking program and verbalize safe progression to improve endurance  Baseline: discussed program today (09/11/22) Goal status: met    LONG TERM GOALS: Target date: 11/16/22  Be independent in advanced HEP Baseline:  Goal status: ongoing  2.  Improve FOTO to > or = to 59 Baseline: 63 Goal status:MET  3.  Report > or = to 60% reduction in LBP and Rt LE pain with ADLs and self-care Baseline: 2-4/10 Goal status: met  4.  Return to gardening and verbalize modifications for rest breaks  Baseline:  Goal status: deferred due to winter, no gardening now  5.  Sit for > or = to 1 hour for longer car  rides without increased LBP and Rt LE pain Baseline:  Goal status: ongoing, 45 min over Christmas    PLAN:  PT FREQUENCY: 2x/week  PT DURATION: 5 weeks  PLANNED INTERVENTIONS: Therapeutic exercises, Therapeutic activity, Neuromuscular re-education, Balance training, Gait training, Patient/Family education, Self Care, Joint mobilization, Joint manipulation, Stair training, Aquatic Therapy, Dry Needling, Electrical stimulation, Spinal manipulation, Spinal mobilization, Taping, Ultrasound, Manual therapy, and Re-evaluation.  PLAN FOR NEXT SESSION: continue hip strength, trunk mobility, functional strength, endurance  Sigurd Sos, PT 10/16/22 1:10 PM

## 2022-10-19 ENCOUNTER — Ambulatory Visit: Payer: PPO | Admitting: Physical Therapy

## 2022-10-19 DIAGNOSIS — M79604 Pain in right leg: Secondary | ICD-10-CM

## 2022-10-19 DIAGNOSIS — M5459 Other low back pain: Secondary | ICD-10-CM

## 2022-10-19 DIAGNOSIS — R252 Cramp and spasm: Secondary | ICD-10-CM

## 2022-10-19 NOTE — Therapy (Signed)
OUTPATIENT PHYSICAL THERAPY TREATMENT       Patient Name: Deborah Adams MRN: [id number] DOB:[birth year], 72 y.o., female Today's Date: 10/19/2022   PT End of Session - 10/19/22 1152     Visit Number 11    Date for PT Re-Evaluation 11/16/22    Authorization Type Medicare Advantage    Progress Note Due on Visit 62    PT Start Time 1147    PT Stop Time 1230    PT Time Calculation (min) 43 min    Activity Tolerance Patient tolerated treatment well    Behavior During Therapy WFL for tasks assessed/performed             Past Medical History:  Diagnosis Date   GERD (gastroesophageal reflux disease)    Hyperlipidemia    Hypertension    Past Surgical History:  Procedure Laterality Date   ABDOMINAL HYSTERECTOMY     BUNIONECTOMY Right    WRIST SURGERY Left    Patient Active Problem List   Diagnosis Date Noted   Thoracic back pain 12/25/2018   HTN (hypertension) 01/12/2011   Hyperlipemia 01/12/2011   GERD (gastroesophageal reflux disease) 01/12/2011   Chronic allergic rhinitis 01/12/2011   Esophageal stricture 01/12/2011   Cystocele 01/12/2011   Stress incontinence, female 01/12/2011    PCP: Lona Kettle, MD  REFERRING PROVIDER: Earnie Larsson, MD  REFERRING DIAG:  Diagnosis  M43.10 (ICD-10-CM) - Spondylolisthesis, site unspecified    Rationale for Evaluation and Treatment: Rehabilitation  THERAPY DIAG:  Other low back pain  Pain in right leg  Cramp and spasm  ONSET DATE: chronic   SUBJECTIVE:                                                                                                                                                                                           SUBJECTIVE STATEMENT: My right knee is really bothersome with stairs. My back and hip both have been feeling a lot better.   PERTINENT HISTORY:  HTN  PAIN:  Are you having pain? Yes: NPRS scale: 0/10   Pain location: Low back into Rt LE Pain description: aching Aggravating  factors: riding in car with bumpy roads Relieving factors: Tylenol, rest/supine  PRECAUTIONS: None  WEIGHT BEARING RESTRICTIONS: No  FALLS:  Has patient fallen in last 6 months? No  LIVING ENVIRONMENT: Lives with: lives with their spouse Lives in: House/apartment Stairs: yes  Has following equipment at home: None  OCCUPATION: retired   PLOF: Independent and Leisure: gardening  PATIENT GOALS: reduce LBP, gardening, return to walking   NEXT MD VISIT: 09/13/22  OBJECTIVE:   DIAGNOSTIC FINDINGS:  MRI: IMPRESSION:  1. No acute findings or clear explanation for right lower extremity radicular symptoms. 2. Mild asymmetric narrowing of the left lateral recess at L3-4 with possible left L4 nerve root encroachment. 3. Mild spinal stenosis and mild asymmetric right lateral recess narrowing at L4-5 with potential extraforaminal right L4 nerve root encroachment. 4. Possible extraforaminal left L5 nerve root encroachment by disc bulging and facet hypertrophy at L5-S1.    PATIENT SURVEYS:  10/12/22: FOTO 63%, goal met 09/26/22: FOTO 56% Eval: FOTO 49 (goal is 59)  SCREENING FOR RED FLAGS: Bowel or bladder incontinence: No Spinal tumors: No Cauda equina syndrome: No Compression fracture: No Abdominal aneurysm: No  COGNITION: Overall cognitive status: Within functional limits for tasks assessed     SENSATION: WFL  MUSCLE LENGTH: Rt limited by 30%, Lt limited by 25%  POSTURE: rounded shoulders, forward head, and flexed trunk   PALPATION: Diffuse palpable tenderness over bil lumbar paraspinals and Rt>Lt gluteals.  Significant reduction in spinal mobility with PA mobs in thoracic and lumbar spine with pain L1-4.  Tender over bil SI joints  LUMBAR ROM:   AROM eval 10/12/22  Flexion Full-pain Full no pain  Extension Limited by 50%-pain Full no pain  Right lateral flexion Limited by 25%- pain Full no pain  Left lateral flexion full Full no pain  Right rotation  75%  Left  rotation  50%   (Blank rows = not tested)  LOWER EXTREMITY ROM:      10/12/22: Rt hip stiff compared to Lt into flexion, ER and IR, limited by 25% with some muscle guarding due to recent acute flare up which is now nearly fully recovered Eval: Rt hip pain with P/ROM Rt hip into flexion.  Bil hip rotation is limited by 25-50%   LOWER EXTREMITY MMT:   10/12/22: Rt hip flexion and hamstring 4+/5, 5/5 hip abd and quad Eval: 4+/5 Rt LE, 5/5 Lt LE  LUMBAR SPECIAL TESTS:  Slump test: NegativeRt is negative   GAIT: 10/12/22: 6' walk test: 1,110 feet - Rt hip fatigue and pain by last 30" 09/21/22: 6' walk test: 1,116 feet  Distance walked: 50 Assistive device utilized: None Level of assistance: Complete Independence Comments: improved arm swing, pelvic rotation, lacks trunk rotation, slight Trendelenburg Lt hip  TODAY'S TREATMENT:   10/19/22: NuStep L4 x 6' PT present to review goals Supine lower trunk rotation x 20 Supine HS stretch, dynamic x 10 slow kicks bil Rt hip fig 4 and piriformis seated 3x20 seconds  Supine bridge 2 x 10   Sit to stand from mat table 1x15 with 5lb kbell chest press Pelvic tilt with ball squeeze in supine: 5" hold x10 Supine hooklying hip abd 1x15- green theraband loop  Supine hooklying marching with PPT 1x15       10/16/22: NuStep L4 x 8' PT present to review goals Supine lower trunk rotation x 20 Supine HS stretch, dynamic x 10 slow kicks bil Rt hip fig 4 and piriformis seated 3x20 seconds  Supine bridge 2 x 10   Sit to stand from mat table 1x15 with 5lb kbell chest press Pelvic tilt with ball squeeze in supine: 5" hold x10 Supine hooklying hip abd 1x15- green theraband loop        10/12/22: NuStep L4 x 7' PT present to review goals FOTO, objective measures (see above) 6' walk test: 1,110' with Rt hip pain by last 30" - improved pelvic rotation and arm swing, still lacks trunk rotation and has slight + Trendelenburg on Lt  Supine lower trunk rotation x  20 Supine HS stretch, dynamic x 10 slow kicks bil Rt hip fig 4 and piriformis pull across stretch 3x10" each Supine bridge x 10   10/10/22: NuStep L4 x 7' PT present to discuss status Supine lower trunk rotation 3x10" bil Supine HS stretch, dynamic x 10 slow kicks bil Supine hooklying hip abd 1x10 red loop Supine bridge with hip abd red loop 1x10 Sit to stand from mat table 1x10, 1x5 with 5lb kbell chest press Standing diagonal reverse chops with trunk rotation holding single 3lb dumbbell 2x5 each way Bwd resistance walking holding cable pulley handles 8x5lb Seated HS curl red loop 2x10 bil Standing march tap to 6" step alt LE x 10   PATIENT EDUCATION:  Education details: Access Code: [email or web address], walking program (09/11/22) Person educated: Patient Education method: Consulting civil engineer, Demonstration, and Handouts Education comprehension: verbalized understanding and returned demonstration  HOME EXERCISE PROGRAM: Access Code: [email or web address] URL: [email or web address] Date: 09/21/2022 Prepared by: Venetia Night Beuhring  Exercises - Supine Lower Trunk Rotation  - 3 x daily - 7 x weekly - 1 sets - 3 reps - 20 hold - Hooklying Single Knee to Chest  - 3 x daily - 7 x weekly - 1 sets - 3 reps - 20 hold - Seated Hamstring Stretch  - 3 x daily - 7 x weekly - 1 sets - 3 reps - 20 hold - Supine Figure 4 Piriformis Stretch  - 3 x daily - 7 x weekly - 1 sets - 3 reps - 20 hold - Supine Piriformis Stretch  - 1 x daily - 7 x weekly - 3 reps - 20 hold - Supine Posterior Pelvic Tilt  - 1 x daily - 7 x weekly - 1 sets - 10 reps - Standing Row with Anchored Resistance  - 1 x daily - 7 x weekly - 1 sets - 10 reps - Standing Shoulder Extension with Resistance  - 1 x daily - 7 x weekly - 1 sets - 10 reps - Standing Plank on Wall  - 1 x daily - 7 x weekly - 1 sets - 3 reps - 10 sec hold - Wall Push Up  - 1 x daily - 7 x weekly - 1 sets - 10 reps - Sit to Stand  - 1 x daily - 7 x weekly - 1 sets - 10 reps -  Seated Hip Abduction with Resistance  - 1 x daily - 7 x weekly - 3 sets - 10 reps - Standing March  - 1 x daily - 7 x weekly - 1 sets - 20 reps Patient Education - walking program   ASSESSMENT:  CLINICAL IMPRESSION: Pt continues to report 75% overall improvement in symptoms since the start of care. Pt with some Rt knee pain today that is typical. Discussed following up with MD about alternative treatments due to limitations with stairs and nustep today. Pt continues to do well with prescribed exercises with min cues required for forum today. She will continue to benefit from skilled PT to maximize mobility, ROM and strength for optimal daily task performance.  OBJECTIVE IMPAIRMENTS: decreased activity tolerance, decreased balance, decreased endurance, difficulty walking, decreased ROM, decreased strength, hypomobility, increased muscle spasms, impaired flexibility, improper body mechanics, postural dysfunction, and pain.   ACTIVITY LIMITATIONS: carrying, lifting, sitting, standing, bed mobility, and locomotion level  PARTICIPATION LIMITATIONS: meal prep, cleaning, laundry, shopping, community activity, and yard work  PERSONAL FACTORS: Past/current experiences and 1 comorbidity: time since onset and spinal  stensis  are also affecting patient's functional outcome.   REHAB POTENTIAL: Good  CLINICAL DECISION MAKING: Stable/uncomplicated  EVALUATION COMPLEXITY: Low   GOALS: Goals reviewed with patient? Yes  SHORT TERM GOALS: Target date: 09/20/2022  Be independent in initial HEP Baseline: Goal status: met   2.  Report > or = to 30% reduction in LBP and Rt LE pain with ADLs and self-care Baseline: no pain today (09/11/22) Goal status: met  3.  Verbalize and demonstrate body mechanics modifications for spinal protection with daily tasks  Baseline:  Goal status: met  4.  Initiate a walking program and verbalize safe progression to improve endurance  Baseline: discussed program today  (09/11/22) Goal status: met    LONG TERM GOALS: Target date: 11/16/22  Be independent in advanced HEP Baseline:  Goal status: ongoing  2.  Improve FOTO to > or = to 59 Baseline: 63 Goal status:MET  3.  Report > or = to 60% reduction in LBP and Rt LE pain with ADLs and self-care Baseline: 2-4/10 Goal status: met  4.  Return to gardening and verbalize modifications for rest breaks  Baseline:  Goal status: deferred due to winter, no gardening now  5.  Sit for > or = to 1 hour for longer car rides without increased LBP and Rt LE pain Baseline:  Goal status: ongoing, 45 min over Christmas    PLAN:  PT FREQUENCY: 2x/week  PT DURATION: 5 weeks  PLANNED INTERVENTIONS: Therapeutic exercises, Therapeutic activity, Neuromuscular re-education, Balance training, Gait training, Patient/Family education, Self Care, Joint mobilization, Joint manipulation, Stair training, Aquatic Therapy, Dry Needling, Electrical stimulation, Spinal manipulation, Spinal mobilization, Taping, Ultrasound, Manual therapy, and Re-evaluation.  PLAN FOR NEXT SESSION: continue hip strength, trunk mobility, functional strength, endurance  Rudi Heap PT, DPT 10/19/22  1:10 PM

## 2022-10-26 DIAGNOSIS — K5520 Angiodysplasia of colon without hemorrhage: Secondary | ICD-10-CM | POA: Diagnosis not present

## 2022-10-26 DIAGNOSIS — Z1211 Encounter for screening for malignant neoplasm of colon: Secondary | ICD-10-CM | POA: Diagnosis not present

## 2022-10-26 DIAGNOSIS — K648 Other hemorrhoids: Secondary | ICD-10-CM | POA: Diagnosis not present

## 2022-10-26 DIAGNOSIS — K5730 Diverticulosis of large intestine without perforation or abscess without bleeding: Secondary | ICD-10-CM | POA: Diagnosis not present

## 2022-10-26 DIAGNOSIS — D125 Benign neoplasm of sigmoid colon: Secondary | ICD-10-CM | POA: Diagnosis not present

## 2022-10-26 DIAGNOSIS — K635 Polyp of colon: Secondary | ICD-10-CM | POA: Diagnosis not present

## 2022-10-31 DIAGNOSIS — K635 Polyp of colon: Secondary | ICD-10-CM | POA: Diagnosis not present

## 2022-10-31 DIAGNOSIS — D125 Benign neoplasm of sigmoid colon: Secondary | ICD-10-CM | POA: Diagnosis not present

## 2022-11-01 ENCOUNTER — Ambulatory Visit: Payer: PPO

## 2022-11-01 DIAGNOSIS — M5459 Other low back pain: Secondary | ICD-10-CM | POA: Diagnosis not present

## 2022-11-01 DIAGNOSIS — R252 Cramp and spasm: Secondary | ICD-10-CM

## 2022-11-01 DIAGNOSIS — M79604 Pain in right leg: Secondary | ICD-10-CM

## 2022-11-01 NOTE — Therapy (Signed)
OUTPATIENT PHYSICAL THERAPY TREATMENT       Patient Name: Deborah Adams MRN: [id number] DOB:[birth year], 72 y.o., female Today's Date: 11/01/2022   PT End of Session - 11/01/22 1226     Visit Number 12    Date for PT Re-Evaluation 11/16/22    Authorization Type Medicare Advantage    Progress Note Due on Visit 12    PT Start Time 1147    PT Stop Time 1227    PT Time Calculation (min) 40 min    Activity Tolerance Patient tolerated treatment well    Behavior During Therapy [facility] for tasks assessed/performed              Past Medical History:  Diagnosis Date   GERD (gastroesophageal reflux disease)    Hyperlipidemia    Hypertension    Past Surgical History:  Procedure Laterality Date   ABDOMINAL HYSTERECTOMY     BUNIONECTOMY Right    WRIST SURGERY Left    Patient Active Problem List   Diagnosis Date Noted   Thoracic back pain 12/25/2018   HTN (hypertension) 01/12/2011   Hyperlipemia 01/12/2011   GERD (gastroesophageal reflux disease) 01/12/2011   Chronic allergic rhinitis 01/12/2011   Esophageal stricture 01/12/2011   Cystocele 01/12/2011   Stress incontinence, female 01/12/2011    PCP: Lona Kettle, MD  REFERRING PROVIDER: Earnie Larsson, MD  REFERRING DIAG:  Diagnosis  M43.10 (ICD-10-CM) - Spondylolisthesis, site unspecified    Rationale for Evaluation and Treatment: Rehabilitation  THERAPY DIAG:  Other low back pain  Pain in right leg  Cramp and spasm  ONSET DATE: chronic   SUBJECTIVE:                                                                                                                                                                                           SUBJECTIVE STATEMENT: Lapse in treatment due to conflict in schedule.  Overall doing good.  My Rt hip sometimes bothers me.  PERTINENT HISTORY:  HTN  PAIN:  Are you having pain? Yes: NPRS scale: 0/10   Pain location: Low back into Rt LE Pain description: aching Aggravating  factors: riding in car with bumpy roads Relieving factors: Tylenol, rest/supine  PRECAUTIONS: None  WEIGHT BEARING RESTRICTIONS: No  FALLS:  Has patient fallen in last 6 months? No  LIVING ENVIRONMENT: Lives with: lives with their spouse Lives in: House/apartment Stairs: yes  Has following equipment at home: None  OCCUPATION: retired   PLOF: Independent and Leisure: gardening  PATIENT GOALS: reduce LBP, gardening, return to walking   NEXT MD VISIT: 09/13/22  OBJECTIVE:   DIAGNOSTIC FINDINGS:  MRI: IMPRESSION:  1. No acute findings or clear explanation for right lower extremity radicular symptoms. 2. Mild asymmetric narrowing of the left lateral recess at L3-4 with possible left L4 nerve root encroachment. 3. Mild spinal stenosis and mild asymmetric right lateral recess narrowing at L4-5 with potential extraforaminal right L4 nerve root encroachment. 4. Possible extraforaminal left L5 nerve root encroachment by disc bulging and facet hypertrophy at L5-S1.    PATIENT SURVEYS:  10/12/22: FOTO 63%, goal met 09/26/22: FOTO 56% Eval: FOTO 49 (goal is 59)  SCREENING FOR RED FLAGS: Bowel or bladder incontinence: No Spinal tumors: No Cauda equina syndrome: No Compression fracture: No Abdominal aneurysm: No  COGNITION: Overall cognitive status: Within functional limits for tasks assessed     SENSATION: WFL  MUSCLE LENGTH: Rt limited by 30%, Lt limited by 25%  POSTURE: rounded shoulders, forward head, and flexed trunk   PALPATION: Diffuse palpable tenderness over bil lumbar paraspinals and Rt>Lt gluteals.  Significant reduction in spinal mobility with PA mobs in thoracic and lumbar spine with pain L1-4.  Tender over bil SI joints  LUMBAR ROM:   AROM eval 10/12/22  Flexion Full-pain Full no pain  Extension Limited by 50%-pain Full no pain  Right lateral flexion Limited by 25%- pain Full no pain  Left lateral flexion full Full no pain  Right rotation  75%  Left  rotation  50%   (Blank rows = not tested)  LOWER EXTREMITY ROM:      10/12/22: Rt hip stiff compared to Lt into flexion, ER and IR, limited by 25% with some muscle guarding due to recent acute flare up which is now nearly fully recovered Eval: Rt hip pain with P/ROM Rt hip into flexion.  Bil hip rotation is limited by 25-50%   LOWER EXTREMITY MMT:   10/12/22: Rt hip flexion and hamstring 4+/5, 5/5 hip abd and quad Eval: 4+/5 Rt LE, 5/5 Lt LE  LUMBAR SPECIAL TESTS:  Slump test: NegativeRt is negative   GAIT: 10/12/22: 6' walk test: 1,110 feet - Rt hip fatigue and pain by last 30" 09/21/22: 6' walk test: 1,116 feet  Distance walked: 50 Assistive device utilized: None Level of assistance: Complete Independence Comments: improved arm swing, pelvic rotation, lacks trunk rotation, slight Trendelenburg Lt hip  TODAY'S TREATMENT:   11/01/22: NuStep L4 x 8' PT present to discuss progress Supine lower trunk rotation x 20 Seated hamstring stretch: 3x20 seconds  Rt hip piriformis stretch 3x20 seconds  Supine bridge 2 x 10   Sit to stand from mat table 1x15 with 5lb kbell chest press Pelvic tilt with ball squeeze in supine: 5" hold x15 Supine hooklying hip abd 1x15- blue loop  Shoulder extension and row: green band 2x10 with TA activation   10/19/22: NuStep L4 x 6' PT present to review goals Supine lower trunk rotation x 20 Supine HS stretch, dynamic x 10 slow kicks bil Rt hip fig 4 and piriformis seated 3x20 seconds  Supine bridge 2 x 10   Sit to stand from mat table 1x15 with 5lb kbell chest press Pelvic tilt with ball squeeze in supine: 5" hold x10 Supine hooklying hip abd 1x15- green theraband loop  Supine hooklying marching with PPT 1x15       10/16/22: NuStep L4 x 8' PT present to review goals Supine lower trunk rotation x 20 Supine HS stretch, dynamic x 10 slow kicks bil Rt hip fig 4 and piriformis seated 3x20 seconds  Supine bridge 2 x 10   Sit to stand  from mat table 1x15 with  5lb kbell chest press Pelvic tilt with ball squeeze in supine: 5" hold x10 Supine hooklying hip abd 1x15- green theraband loop        PATIENT EDUCATION:  Education details: Access Code: [email or web address], walking program (09/11/22) Person educated: Patient Education method: Explanation, Demonstration, and Handouts Education comprehension: verbalized understanding and returned demonstration  HOME EXERCISE PROGRAM: Access Code: [email or web address] URL: [email or web address] Date: 09/21/2022 Prepared by: Venetia Night Beuhring  Exercises - Supine Lower Trunk Rotation  - 3 x daily - 7 x weekly - 1 sets - 3 reps - 20 hold - Hooklying Single Knee to Chest  - 3 x daily - 7 x weekly - 1 sets - 3 reps - 20 hold - Seated Hamstring Stretch  - 3 x daily - 7 x weekly - 1 sets - 3 reps - 20 hold - Supine Figure 4 Piriformis Stretch  - 3 x daily - 7 x weekly - 1 sets - 3 reps - 20 hold - Supine Piriformis Stretch  - 1 x daily - 7 x weekly - 3 reps - 20 hold - Supine Posterior Pelvic Tilt  - 1 x daily - 7 x weekly - 1 sets - 10 reps - Standing Row with Anchored Resistance  - 1 x daily - 7 x weekly - 1 sets - 10 reps - Standing Shoulder Extension with Resistance  - 1 x daily - 7 x weekly - 1 sets - 10 reps - Standing Plank on Wall  - 1 x daily - 7 x weekly - 1 sets - 3 reps - 10 sec hold - Wall Push Up  - 1 x daily - 7 x weekly - 1 sets - 10 reps - Sit to Stand  - 1 x daily - 7 x weekly - 1 sets - 10 reps - Seated Hip Abduction with Resistance  - 1 x daily - 7 x weekly - 3 sets - 10 reps - Standing March  - 1 x daily - 7 x weekly - 1 sets - 20 reps Patient Education - walking program   ASSESSMENT:  CLINICAL IMPRESSION: Pt continues to report 75% overall improvement in symptoms since the start of care.  Pt had a lapse in treatment due to conflict in schedule and reports that she as not been as consistent with her exercises as she had been.  PT monitored throughout session for alignment, technique and pain. She  will continue to benefit from skilled PT to maximize mobility, ROM and strength for optimal daily task performance.  OBJECTIVE IMPAIRMENTS: decreased activity tolerance, decreased balance, decreased endurance, difficulty walking, decreased ROM, decreased strength, hypomobility, increased muscle spasms, impaired flexibility, improper body mechanics, postural dysfunction, and pain.   ACTIVITY LIMITATIONS: carrying, lifting, sitting, standing, bed mobility, and locomotion level  PARTICIPATION LIMITATIONS: meal prep, cleaning, laundry, shopping, community activity, and yard work  PERSONAL FACTORS: Past/current experiences and 1 comorbidity: time since onset and spinal stensis  are also affecting patient's functional outcome.   REHAB POTENTIAL: Good  CLINICAL DECISION MAKING: Stable/uncomplicated  EVALUATION COMPLEXITY: Low   GOALS: Goals reviewed with patient? Yes  SHORT TERM GOALS: Target date: 09/20/2022  Be independent in initial HEP Baseline: Goal status: met   2.  Report > or = to 30% reduction in LBP and Rt LE pain with ADLs and self-care Baseline: no pain today (09/11/22) Goal status: met  3.  Verbalize and demonstrate body mechanics modifications for spinal protection with daily tasks  Baseline:  Goal  status: met  4.  Initiate a walking program and verbalize safe progression to improve endurance  Baseline: discussed program today (09/11/22) Goal status: met    LONG TERM GOALS: Target date: 11/16/22  Be independent in advanced HEP Baseline:  Goal status: ongoing  2.  Improve FOTO to > or = to 59 Baseline: 63 Goal status:MET  3.  Report > or = to 60% reduction in LBP and Rt LE pain with ADLs and self-care Baseline: 2-4/10 Goal status: met  4.  Return to gardening and verbalize modifications for rest breaks  Baseline:  Goal status: deferred due to winter, no gardening now  5.  Sit for > or = to 1 hour for longer car rides without increased LBP and Rt LE  pain Baseline:  Goal status: ongoing, 45 min over Christmas    PLAN:  PT FREQUENCY: 2x/week  PT DURATION: 5 weeks  PLANNED INTERVENTIONS: Therapeutic exercises, Therapeutic activity, Neuromuscular re-education, Balance training, Gait training, Patient/Family education, Self Care, Joint mobilization, Joint manipulation, Stair training, Aquatic Therapy, Dry Needling, Electrical stimulation, Spinal manipulation, Spinal mobilization, Taping, Ultrasound, Manual therapy, and Re-evaluation.  PLAN FOR NEXT SESSION: continue hip strength, trunk mobility, functional strength, endurance  Sigurd Sos, PT 11/01/22 12:27 PM

## 2022-11-02 ENCOUNTER — Ambulatory Visit: Payer: PPO | Admitting: Physical Therapy

## 2022-11-02 ENCOUNTER — Encounter: Payer: Self-pay | Admitting: Physical Therapy

## 2022-11-02 DIAGNOSIS — M79604 Pain in right leg: Secondary | ICD-10-CM

## 2022-11-02 DIAGNOSIS — R252 Cramp and spasm: Secondary | ICD-10-CM

## 2022-11-02 DIAGNOSIS — M5459 Other low back pain: Secondary | ICD-10-CM | POA: Diagnosis not present

## 2022-11-02 NOTE — Therapy (Signed)
OUTPATIENT PHYSICAL THERAPY TREATMENT       Patient Name: Deborah Adams MRN: [id number] DOB:11-Aug-1951, 72 y.o., female Today's Date: 11/02/2022   PT End of Session - 11/02/22 1155     Visit Number 13    Date for PT Re-Evaluation 11/16/22    Authorization Type Medicare Advantage    Progress Note Due on Visit 54    PT Start Time 1151    PT Stop Time 1231    PT Time Calculation (min) 40 min    Activity Tolerance Patient tolerated treatment well    Behavior During Therapy WFL for tasks assessed/performed               Past Medical History:  Diagnosis Date   GERD (gastroesophageal reflux disease)    Hyperlipidemia    Hypertension    Past Surgical History:  Procedure Laterality Date   ABDOMINAL HYSTERECTOMY     BUNIONECTOMY Right    WRIST SURGERY Left    Patient Active Problem List   Diagnosis Date Noted   Thoracic back pain 12/25/2018   HTN (hypertension) 01/12/2011   Hyperlipemia 01/12/2011   GERD (gastroesophageal reflux disease) 01/12/2011   Chronic allergic rhinitis 01/12/2011   Esophageal stricture 01/12/2011   Cystocele 01/12/2011   Stress incontinence, female 01/12/2011    PCP: Lona Kettle, MD  REFERRING PROVIDER: Earnie Larsson, MD  REFERRING DIAG:  Diagnosis  M43.10 (ICD-10-CM) - Spondylolisthesis, site unspecified    Rationale for Evaluation and Treatment: Rehabilitation  THERAPY DIAG:  Other low back pain  Pain in right leg  Cramp and spasm  ONSET DATE: chronic   SUBJECTIVE:                                                                                                                                                                                           SUBJECTIVE STATEMENT: I am holding steady with my progress.  PERTINENT HISTORY:  HTN  PAIN:  Are you having pain? Yes: NPRS scale: 0/10   Pain location: Low back into Rt LE Pain description: aching Aggravating factors: riding in car with bumpy roads Relieving factors:  Tylenol, rest/supine  PRECAUTIONS: None  WEIGHT BEARING RESTRICTIONS: No  FALLS:  Has patient fallen in last 6 months? No  LIVING ENVIRONMENT: Lives with: lives with their spouse Lives in: House/apartment Stairs: yes  Has following equipment at home: None  OCCUPATION: retired   PLOF: Independent and Leisure: gardening  PATIENT GOALS: reduce LBP, gardening, return to walking   NEXT MD VISIT: 09/13/22  OBJECTIVE:   DIAGNOSTIC FINDINGS:  MRI: IMPRESSION: 1. No acute findings or clear explanation for right lower extremity  radicular symptoms. 2. Mild asymmetric narrowing of the left lateral recess at L3-4 with possible left L4 nerve root encroachment. 3. Mild spinal stenosis and mild asymmetric right lateral recess narrowing at L4-5 with potential extraforaminal right L4 nerve root encroachment. 4. Possible extraforaminal left L5 nerve root encroachment by disc bulging and facet hypertrophy at L5-S1.    PATIENT SURVEYS:  10/12/22: FOTO 63%, goal met 09/26/22: FOTO 56% Eval: FOTO 49 (goal is 59)  SCREENING FOR RED FLAGS: Bowel or bladder incontinence: No Spinal tumors: No Cauda equina syndrome: No Compression fracture: No Abdominal aneurysm: No  COGNITION: Overall cognitive status: Within functional limits for tasks assessed     SENSATION: WFL  MUSCLE LENGTH: Rt limited by 30%, Lt limited by 25%  POSTURE: rounded shoulders, forward head, and flexed trunk   PALPATION: Diffuse palpable tenderness over bil lumbar paraspinals and Rt>Lt gluteals.  Significant reduction in spinal mobility with PA mobs in thoracic and lumbar spine with pain L1-4.  Tender over bil SI joints  LUMBAR ROM:   AROM eval 10/12/22  Flexion Full-pain Full no pain  Extension Limited by 50%-pain Full no pain  Right lateral flexion Limited by 25%- pain Full no pain  Left lateral flexion full Full no pain  Right rotation  75%  Left rotation  50%   (Blank rows = not tested)  LOWER EXTREMITY  ROM:      10/12/22: Rt hip stiff compared to Lt into flexion, ER and IR, limited by 25% with some muscle guarding due to recent acute flare up which is now nearly fully recovered Eval: Rt hip pain with P/ROM Rt hip into flexion.  Bil hip rotation is limited by 25-50%   LOWER EXTREMITY MMT:   10/12/22: Rt hip flexion and hamstring 4+/5, 5/5 hip abd and quad Eval: 4+/5 Rt LE, 5/5 Lt LE  LUMBAR SPECIAL TESTS:  Slump test: NegativeRt is negative   GAIT: 10/12/22: 6' walk test: 1,110 feet - Rt hip fatigue and pain by last 30" 09/21/22: 6' walk test: 1,116 feet  Distance walked: 50 Assistive device utilized: None Level of assistance: Complete Independence Comments: improved arm swing, pelvic rotation, lacks trunk rotation, slight Trendelenburg Lt hip  TODAY'S TREATMENT:   11/02/22: NuStep L4 x 8' PT present to discuss progress Seated hamstring stretch: 3x20 seconds  Seated pigeon stretch on low mat table 2x20" Lt/Rt Sit to stand with 5lb kbell chest press 1x10 Seated 5lb core series hip to hip x 20, hip to shoulder x 10 each, reverse sit up x 10 Supine pelvic tilt x10 Supine bridge with hip abd blue loop 1x10 Supine lower trunk rotation 5x10" bil Resisted backward walking x 10 5lb cable pulleys Shoulder extension and row: green band 2x10 with TA activation  11/01/22: NuStep L4 x 8' PT present to discuss progress Supine lower trunk rotation x 20 Seated hamstring stretch: 3x20 seconds  Rt hip piriformis stretch 3x20 seconds  Supine bridge 2 x 10   Sit to stand from mat table 1x15 with 5lb kbell chest press Pelvic tilt with ball squeeze in supine: 5" hold x15 Supine hooklying hip abd 1x15- blue loop  Shoulder extension and row: green band 2x10 with TA activation   10/19/22: NuStep L4 x 6' PT present to review goals Supine lower trunk rotation x 20 Supine HS stretch, dynamic x 10 slow kicks bil Rt hip fig 4 and piriformis seated 3x20 seconds  Supine bridge 2 x 10   Sit to stand from  mat table 1x15  with 5lb kbell chest press Pelvic tilt with ball squeeze in supine: 5" hold x10 Supine hooklying hip abd 1x15- green theraband loop  Supine hooklying marching with PPT 1x15            PATIENT EDUCATION:  Education details: Access Code: [email or web address], walking program (09/11/22) Person educated: Patient Education method: Explanation, Demonstration, and Handouts Education comprehension: verbalized understanding and returned demonstration  HOME EXERCISE PROGRAM: Access Code: [email or web address] URL: [email or web address] Date: 09/21/2022 Prepared by: Venetia Night Alexcis Bicking  Exercises - Supine Lower Trunk Rotation  - 3 x daily - 7 x weekly - 1 sets - 3 reps - 20 hold - Hooklying Single Knee to Chest  - 3 x daily - 7 x weekly - 1 sets - 3 reps - 20 hold - Seated Hamstring Stretch  - 3 x daily - 7 x weekly - 1 sets - 3 reps - 20 hold - Supine Figure 4 Piriformis Stretch  - 3 x daily - 7 x weekly - 1 sets - 3 reps - 20 hold - Supine Piriformis Stretch  - 1 x daily - 7 x weekly - 3 reps - 20 hold - Supine Posterior Pelvic Tilt  - 1 x daily - 7 x weekly - 1 sets - 10 reps - Standing Row with Anchored Resistance  - 1 x daily - 7 x weekly - 1 sets - 10 reps - Standing Shoulder Extension with Resistance  - 1 x daily - 7 x weekly - 1 sets - 10 reps - Standing Plank on Wall  - 1 x daily - 7 x weekly - 1 sets - 3 reps - 10 sec hold - Wall Push Up  - 1 x daily - 7 x weekly - 1 sets - 10 reps - Sit to Stand  - 1 x daily - 7 x weekly - 1 sets - 10 reps - Seated Hip Abduction with Resistance  - 1 x daily - 7 x weekly - 3 sets - 10 reps - Standing March  - 1 x daily - 7 x weekly - 1 sets - 20 reps Patient Education - walking program   ASSESSMENT:  CLINICAL IMPRESSION: Pt returns for second consecutive day of treatment today feeling good and without pain.  She feels she maintained gains from PT during her lapse of treatment but "hasn't pushed herself" beyond where she was.  She was able to  advance therex and work rate today with good tolerance.  She is on track to meet goals by end of cert period.    OBJECTIVE IMPAIRMENTS: decreased activity tolerance, decreased balance, decreased endurance, difficulty walking, decreased ROM, decreased strength, hypomobility, increased muscle spasms, impaired flexibility, improper body mechanics, postural dysfunction, and pain.   ACTIVITY LIMITATIONS: carrying, lifting, sitting, standing, bed mobility, and locomotion level  PARTICIPATION LIMITATIONS: meal prep, cleaning, laundry, shopping, community activity, and yard work  PERSONAL FACTORS: Past/current experiences and 1 comorbidity: time since onset and spinal stensis  are also affecting patient's functional outcome.   REHAB POTENTIAL: Good  CLINICAL DECISION MAKING: Stable/uncomplicated  EVALUATION COMPLEXITY: Low   GOALS: Goals reviewed with patient? Yes  SHORT TERM GOALS: Target date: 09/20/2022  Be independent in initial HEP Baseline: Goal status: met   2.  Report > or = to 30% reduction in LBP and Rt LE pain with ADLs and self-care Baseline: no pain today (09/11/22) Goal status: met  3.  Verbalize and demonstrate body mechanics modifications for spinal protection with daily tasks  Baseline:  Goal status:  met  4.  Initiate a walking program and verbalize safe progression to improve endurance  Baseline: discussed program today (09/11/22) Goal status: met    LONG TERM GOALS: Target date: 11/16/22  Be independent in advanced HEP Baseline:  Goal status: ongoing  2.  Improve FOTO to > or = to 59 Baseline: 63 Goal status:MET  3.  Report > or = to 60% reduction in LBP and Rt LE pain with ADLs and self-care Baseline: 2-4/10 Goal status: met  4.  Return to gardening and verbalize modifications for rest breaks  Baseline:  Goal status: deferred due to winter, no gardening now  5.  Sit for > or = to 1 hour for longer car rides without increased LBP and Rt LE  pain Baseline:  Goal status: ongoing, 45 min over Christmas    PLAN:  PT FREQUENCY: 2x/week  PT DURATION: 5 weeks  PLANNED INTERVENTIONS: Therapeutic exercises, Therapeutic activity, Neuromuscular re-education, Balance training, Gait training, Patient/Family education, Self Care, Joint mobilization, Joint manipulation, Stair training, Aquatic Therapy, Dry Needling, Electrical stimulation, Spinal manipulation, Spinal mobilization, Taping, Ultrasound, Manual therapy, and Re-evaluation.  PLAN FOR NEXT SESSION: continue hip strength, trunk mobility, functional strength, endurance  Raynee Mccasland, PT 11/02/22 12:31 PM

## 2022-11-07 ENCOUNTER — Encounter: Payer: Self-pay | Admitting: Physical Therapy

## 2022-11-07 ENCOUNTER — Ambulatory Visit: Payer: PPO | Admitting: Physical Therapy

## 2022-11-07 DIAGNOSIS — M5459 Other low back pain: Secondary | ICD-10-CM | POA: Diagnosis not present

## 2022-11-07 DIAGNOSIS — M79604 Pain in right leg: Secondary | ICD-10-CM

## 2022-11-07 DIAGNOSIS — R252 Cramp and spasm: Secondary | ICD-10-CM

## 2022-11-07 NOTE — Therapy (Signed)
OUTPATIENT PHYSICAL THERAPY TREATMENT       Patient Name: Deborah Adams MRN: [id number] DOB:[birth year], 72 y.o., female Today's Date: 11/07/2022   PT End of Session - 11/07/22 1104     Visit Number 14    Date for PT Re-Evaluation 11/16/22    Authorization Type Medicare Advantage    Progress Note Due on Visit 52    PT Start Time 1100    PT Stop Time 1148    PT Time Calculation (min) 48 min    Activity Tolerance Patient tolerated treatment well    Behavior During Therapy WFL for tasks assessed/performed                Past Medical History:  Diagnosis Date   GERD (gastroesophageal reflux disease)    Hyperlipidemia    Hypertension    Past Surgical History:  Procedure Laterality Date   ABDOMINAL HYSTERECTOMY     BUNIONECTOMY Right    WRIST SURGERY Left    Patient Active Problem List   Diagnosis Date Noted   Thoracic back pain 12/25/2018   HTN (hypertension) 01/12/2011   Hyperlipemia 01/12/2011   GERD (gastroesophageal reflux disease) 01/12/2011   Chronic allergic rhinitis 01/12/2011   Esophageal stricture 01/12/2011   Cystocele 01/12/2011   Stress incontinence, female 01/12/2011    PCP: Lona Kettle, MD  REFERRING PROVIDER: Earnie Larsson, MD  REFERRING DIAG:  Diagnosis  M43.10 (ICD-10-CM) - Spondylolisthesis, site unspecified    Rationale for Evaluation and Treatment: Rehabilitation  THERAPY DIAG:  Other low back pain  Pain in right leg  Cramp and spasm  ONSET DATE: chronic   SUBJECTIVE:                                                                                                                                                                                           SUBJECTIVE STATEMENT: I did well after last time.  I am really not having much pain.    PERTINENT HISTORY:  HTN  PAIN:  Are you having pain? Yes: NPRS scale: 0/10   Pain location: Low back into Rt LE Pain description: aching Aggravating factors: riding in car with bumpy  roads Relieving factors: Tylenol, rest/supine  PRECAUTIONS: None  WEIGHT BEARING RESTRICTIONS: No  FALLS:  Has patient fallen in last 6 months? No  LIVING ENVIRONMENT: Lives with: lives with their spouse Lives in: House/apartment Stairs: yes  Has following equipment at home: None  OCCUPATION: retired   PLOF: Independent and Leisure: gardening  PATIENT GOALS: reduce LBP, gardening, return to walking   NEXT MD VISIT: 09/13/22  OBJECTIVE:   DIAGNOSTIC FINDINGS:  MRI: IMPRESSION: 1.  No acute findings or clear explanation for right lower extremity radicular symptoms. 2. Mild asymmetric narrowing of the left lateral recess at L3-4 with possible left L4 nerve root encroachment. 3. Mild spinal stenosis and mild asymmetric right lateral recess narrowing at L4-5 with potential extraforaminal right L4 nerve root encroachment. 4. Possible extraforaminal left L5 nerve root encroachment by disc bulging and facet hypertrophy at L5-S1.    PATIENT SURVEYS:  10/12/22: FOTO 63%, goal met 09/26/22: FOTO 56% Eval: FOTO 49 (goal is 59)  SCREENING FOR RED FLAGS: Bowel or bladder incontinence: No Spinal tumors: No Cauda equina syndrome: No Compression fracture: No Abdominal aneurysm: No  COGNITION: Overall cognitive status: Within functional limits for tasks assessed     SENSATION: WFL  MUSCLE LENGTH: Rt limited by 30%, Lt limited by 25%  POSTURE: rounded shoulders, forward head, and flexed trunk   PALPATION: Diffuse palpable tenderness over bil lumbar paraspinals and Rt>Lt gluteals.  Significant reduction in spinal mobility with PA mobs in thoracic and lumbar spine with pain L1-4.  Tender over bil SI joints  LUMBAR ROM:   AROM eval 10/12/22  Flexion Full-pain Full no pain  Extension Limited by 50%-pain Full no pain  Right lateral flexion Limited by 25%- pain Full no pain  Left lateral flexion full Full no pain  Right rotation  75%  Left rotation  50%   (Blank rows = not  tested)  LOWER EXTREMITY ROM:      10/12/22: Rt hip stiff compared to Lt into flexion, ER and IR, limited by 25% with some muscle guarding due to recent acute flare up which is now nearly fully recovered Eval: Rt hip pain with P/ROM Rt hip into flexion.  Bil hip rotation is limited by 25-50%   LOWER EXTREMITY MMT:   10/12/22: Rt hip flexion and hamstring 4+/5, 5/5 hip abd and quad Eval: 4+/5 Rt LE, 5/5 Lt LE  LUMBAR SPECIAL TESTS:  Slump test: NegativeRt is negative   GAIT: 10/12/22: 6' walk test: 1,110 feet - Rt hip fatigue and pain by last 30" 09/21/22: 6' walk test: 1,116 feet  Distance walked: 50 Assistive device utilized: None Level of assistance: Complete Independence Comments: improved arm swing, pelvic rotation, lacks trunk rotation, slight Trendelenburg Lt hip  TODAY'S TREATMENT:   11/07/22: NuStep L4 x 8' PT present to discuss progress Seated hamstring stretch: 3x20 seconds  Seated pigeon stretch on low mat table 2x20" Lt/Rt Sit to stand with 5lb kbell chest press 1x10 Seated 5lb V chest press x 10 Standing UE diagonals 3lb dumbbell 2x5 Seated trunk flexion hands on large blue physioball x 8 Resisted backward walking x 10 5lb cable pulleys Shoulder row: green band 1x20 standing on airex pad with TA activation SLS on airex pad foot on 2nd step single UE on rail 20x3" holds for glut activation awareness Seated deadlift 10lb x 10 reps Supine lower trunk rotation 5x10" bil Supine pelvic tilt 10x10" Moist heat lumbar x 8' end of session for soreness  11/02/22: NuStep L4 x 8' PT present to discuss progress Seated hamstring stretch: 3x20 seconds  Seated pigeon stretch on low mat table 2x20" Lt/Rt Sit to stand with 5lb kbell chest press 1x10 Seated 5lb core series hip to hip x 20, hip to shoulder x 10 each, reverse sit up x 10 Supine pelvic tilt x10 Supine bridge with hip abd blue loop 1x10 Supine lower trunk rotation 5x10" bil Resisted backward walking x 10 5lb cable  pulleys Shoulder extension and row: green band  2x10 with TA activation  11/01/22: NuStep L4 x 8' PT present to discuss progress Supine lower trunk rotation x 20 Seated hamstring stretch: 3x20 seconds  Rt hip piriformis stretch 3x20 seconds  Supine bridge 2 x 10   Sit to stand from mat table 1x15 with 5lb kbell chest press Pelvic tilt with ball squeeze in supine: 5" hold x15 Supine hooklying hip abd 1x15- blue loop  Shoulder extension and row: green band 2x10 with TA activation           PATIENT EDUCATION:  Education details: Access Code: [email or web address], walking program (09/11/22) Person educated: Patient Education method: Explanation, Demonstration, and Handouts Education comprehension: verbalized understanding and returned demonstration  HOME EXERCISE PROGRAM: Access Code: [email or web address] URL: [email or web address] Date: 09/21/2022 Prepared by: Venetia Night Maddalena Linarez  Exercises - Supine Lower Trunk Rotation  - 3 x daily - 7 x weekly - 1 sets - 3 reps - 20 hold - Hooklying Single Knee to Chest  - 3 x daily - 7 x weekly - 1 sets - 3 reps - 20 hold - Seated Hamstring Stretch  - 3 x daily - 7 x weekly - 1 sets - 3 reps - 20 hold - Supine Figure 4 Piriformis Stretch  - 3 x daily - 7 x weekly - 1 sets - 3 reps - 20 hold - Supine Piriformis Stretch  - 1 x daily - 7 x weekly - 3 reps - 20 hold - Supine Posterior Pelvic Tilt  - 1 x daily - 7 x weekly - 1 sets - 10 reps - Standing Row with Anchored Resistance  - 1 x daily - 7 x weekly - 1 sets - 10 reps - Standing Shoulder Extension with Resistance  - 1 x daily - 7 x weekly - 1 sets - 10 reps - Standing Plank on Wall  - 1 x daily - 7 x weekly - 1 sets - 3 reps - 10 sec hold - Wall Push Up  - 1 x daily - 7 x weekly - 1 sets - 10 reps - Sit to Stand  - 1 x daily - 7 x weekly - 1 sets - 10 reps - Seated Hip Abduction with Resistance  - 1 x daily - 7 x weekly - 3 sets - 10 reps - Standing March  - 1 x daily - 7 x weekly - 1 sets - 20  reps Patient Education - walking program   ASSESSMENT:  CLINICAL IMPRESSION: Pt reports she is not experiencing much pain at all recently.  She has not been actively walking for exercise other than running errands, so PT nudged her to encourage her to return to this to maintain mobility and endurance gained to date.  PT progressed resistance through UE for trunk stabilization today with good tolerance.  Added airex pad under feet for march to 2nd step and with standing row.  Pt did report some soreness in Rt lumbar region after progression of therex today so moist heat used end of session.    OBJECTIVE IMPAIRMENTS: decreased activity tolerance, decreased balance, decreased endurance, difficulty walking, decreased ROM, decreased strength, hypomobility, increased muscle spasms, impaired flexibility, improper body mechanics, postural dysfunction, and pain.   ACTIVITY LIMITATIONS: carrying, lifting, sitting, standing, bed mobility, and locomotion level  PARTICIPATION LIMITATIONS: meal prep, cleaning, laundry, shopping, community activity, and yard work  PERSONAL FACTORS: Past/current experiences and 1 comorbidity: time since onset and spinal stensis  are also affecting patient's functional outcome.   REHAB POTENTIAL: Good  CLINICAL  DECISION MAKING: Stable/uncomplicated  EVALUATION COMPLEXITY: Low   GOALS: Goals reviewed with patient? Yes  SHORT TERM GOALS: Target date: 09/20/2022  Be independent in initial HEP Baseline: Goal status: met   2.  Report > or = to 30% reduction in LBP and Rt LE pain with ADLs and self-care Baseline: no pain today (09/11/22) Goal status: met  3.  Verbalize and demonstrate body mechanics modifications for spinal protection with daily tasks  Baseline:  Goal status: met  4.  Initiate a walking program and verbalize safe progression to improve endurance  Baseline: discussed program today (09/11/22) Goal status: met    LONG TERM GOALS: Target date:  11/16/22  Be independent in advanced HEP Baseline:  Goal status: ongoing  2.  Improve FOTO to > or = to 59 Baseline: 63 Goal status: met  3.  Report > or = to 60% reduction in LBP and Rt LE pain with ADLs and self-care Baseline: 2-4/10 Goal status: met  4.  Return to gardening and verbalize modifications for rest breaks  Baseline:  Goal status: deferred due to winter, no gardening now  5.  Sit for > or = to 1 hour for longer car rides without increased LBP and Rt LE pain Baseline:  Goal status: ongoing, 45 min over Christmas    PLAN:  PT FREQUENCY: 2x/week  PT DURATION: 5 weeks  PLANNED INTERVENTIONS: Therapeutic exercises, Therapeutic activity, Neuromuscular re-education, Balance training, Gait training, Patient/Family education, Self Care, Joint mobilization, Joint manipulation, Stair training, Aquatic Therapy, Dry Needling, Electrical stimulation, Spinal manipulation, Spinal mobilization, Taping, Ultrasound, Manual therapy, and Re-evaluation.  PLAN FOR NEXT SESSION: continue hip strength, trunk mobility, functional strength, endurance  Nicolae Vasek, PT 11/07/22 11:39 AM

## 2022-11-09 ENCOUNTER — Encounter: Payer: Self-pay | Admitting: Physical Therapy

## 2022-11-09 ENCOUNTER — Ambulatory Visit: Payer: PPO | Attending: Neurosurgery | Admitting: Physical Therapy

## 2022-11-09 DIAGNOSIS — R252 Cramp and spasm: Secondary | ICD-10-CM | POA: Insufficient documentation

## 2022-11-09 DIAGNOSIS — M5459 Other low back pain: Secondary | ICD-10-CM | POA: Insufficient documentation

## 2022-11-09 DIAGNOSIS — M79604 Pain in right leg: Secondary | ICD-10-CM | POA: Diagnosis not present

## 2022-11-09 NOTE — Therapy (Signed)
OUTPATIENT PHYSICAL THERAPY TREATMENT       Patient Name: Deborah Adams MRN: [id number] DOB:1951-10-02, 72 y.o., female Today's Date: 11/09/2022   PT End of Session - 11/09/22 1451     Visit Number 15    Date for PT Re-Evaluation 11/16/22    Authorization Type Medicare Advantage    Progress Note Due on Visit 55    PT Start Time 1447    PT Stop Time 1534    PT Time Calculation (min) 47 min    Activity Tolerance Patient tolerated treatment well    Behavior During Therapy WFL for tasks assessed/performed                Past Medical History:  Diagnosis Date   GERD (gastroesophageal reflux disease)    Hyperlipidemia    Hypertension    Past Surgical History:  Procedure Laterality Date   ABDOMINAL HYSTERECTOMY     BUNIONECTOMY Right    WRIST SURGERY Left    Patient Active Problem List   Diagnosis Date Noted   Thoracic back pain 12/25/2018   HTN (hypertension) 01/12/2011   Hyperlipemia 01/12/2011   GERD (gastroesophageal reflux disease) 01/12/2011   Chronic allergic rhinitis 01/12/2011   Esophageal stricture 01/12/2011   Cystocele 01/12/2011   Stress incontinence, female 01/12/2011    PCP: Lona Kettle, MD  REFERRING PROVIDER: Earnie Larsson, MD  REFERRING DIAG:  Diagnosis  M43.10 (ICD-10-CM) - Spondylolisthesis, site unspecified    Rationale for Evaluation and Treatment: Rehabilitation  THERAPY DIAG:  Other low back pain  Pain in right leg  Cramp and spasm  ONSET DATE: chronic   SUBJECTIVE:                                                                                                                                                                                           SUBJECTIVE STATEMENT: I had some back pain after last visit but it didn't last.  The heat helped.  My hands hurt to hold the kettlebell due to arthritis.  PERTINENT HISTORY:  HTN  PAIN:  Are you having pain? Yes: NPRS scale: 0/10   Pain location: Low back into Rt LE Pain  description: aching Aggravating factors: riding in car with bumpy roads Relieving factors: Tylenol, rest/supine  PRECAUTIONS: None  WEIGHT BEARING RESTRICTIONS: No  FALLS:  Has patient fallen in last 6 months? No  LIVING ENVIRONMENT: Lives with: lives with their spouse Lives in: House/apartment Stairs: yes  Has following equipment at home: None  OCCUPATION: retired   PLOF: Independent and Leisure: gardening  PATIENT GOALS: reduce LBP, gardening, return to walking   NEXT MD VISIT:  09/13/22  OBJECTIVE:   DIAGNOSTIC FINDINGS:  MRI: IMPRESSION: 1. No acute findings or clear explanation for right lower extremity radicular symptoms. 2. Mild asymmetric narrowing of the left lateral recess at L3-4 with possible left L4 nerve root encroachment. 3. Mild spinal stenosis and mild asymmetric right lateral recess narrowing at L4-5 with potential extraforaminal right L4 nerve root encroachment. 4. Possible extraforaminal left L5 nerve root encroachment by disc bulging and facet hypertrophy at L5-S1.    PATIENT SURVEYS:  10/12/22: FOTO 63%, goal met 09/26/22: FOTO 56% Eval: FOTO 49 (goal is 59)  SCREENING FOR RED FLAGS: Bowel or bladder incontinence: No Spinal tumors: No Cauda equina syndrome: No Compression fracture: No Abdominal aneurysm: No  COGNITION: Overall cognitive status: Within functional limits for tasks assessed     SENSATION: WFL  MUSCLE LENGTH: Rt limited by 30%, Lt limited by 25%  POSTURE: rounded shoulders, forward head, and flexed trunk   PALPATION: Diffuse palpable tenderness over bil lumbar paraspinals and Rt>Lt gluteals.  Significant reduction in spinal mobility with PA mobs in thoracic and lumbar spine with pain L1-4.  Tender over bil SI joints  LUMBAR ROM:   AROM eval 10/12/22  Flexion Full-pain Full no pain  Extension Limited by 50%-pain Full no pain  Right lateral flexion Limited by 25%- pain Full no pain  Left lateral flexion full Full no  pain  Right rotation  75%  Left rotation  50%   (Blank rows = not tested)  LOWER EXTREMITY ROM:      10/12/22: Rt hip stiff compared to Lt into flexion, ER and IR, limited by 25% with some muscle guarding due to recent acute flare up which is now nearly fully recovered Eval: Rt hip pain with P/ROM Rt hip into flexion.  Bil hip rotation is limited by 25-50%   LOWER EXTREMITY MMT:   10/12/22: Rt hip flexion and hamstring 4+/5, 5/5 hip abd and quad Eval: 4+/5 Rt LE, 5/5 Lt LE  LUMBAR SPECIAL TESTS:  Slump test: NegativeRt is negative   GAIT: 10/12/22: 6' walk test: 1,110 feet - Rt hip fatigue and pain by last 30" 09/21/22: 6' walk test: 1,116 feet  Distance walked: 50 Assistive device utilized: None Level of assistance: Complete Independence Comments: improved arm swing, pelvic rotation, lacks trunk rotation, slight Trendelenburg Lt hip  TODAY'S TREATMENT:   11/09/22: NuStep L5 x 8' PT present to discuss status Seated trunk flexion hands on large blue physioball x 5, flexion with SB alt Lt/Rt x 3 rounds Seated hamstring stretch: 3x20 seconds  Seated pigeon stretch on low mat table 2x20" Lt/Rt Sit to stand with 3lb chest press 1x10 SLS on airex pad foot on 2nd step single UE on rail 20x3" holds for glut activation awareness Resisted backward walking x 10 5lb cable pulleys Supine lower trunk rotation 3x20" Supine bridge with hip abd yellow loop 1x10 Supine yellow band d2 flexion x10 each Moist heat lumbar spine end of session x 6'  11/07/22: NuStep L4 x 8' PT present to discuss progress Seated hamstring stretch: 3x20 seconds  Seated pigeon stretch on low mat table 2x20" Lt/Rt Sit to stand with 5lb kbell chest press 1x10 Seated 5lb V chest press x 10 Standing UE diagonals 3lb dumbbell 2x5 Seated trunk flexion hands on large blue physioball x 8 Resisted backward walking x 10 5lb cable pulleys Shoulder row: green band 1x20 standing on airex pad with TA activation SLS on airex pad foot  on 2nd step single UE on rail 20x3"  holds for glut activation awareness Seated deadlift 10lb x 10 reps Supine lower trunk rotation 5x10" bil Supine pelvic tilt 10x10" Moist heat lumbar x 8' end of session for soreness  11/02/22: NuStep L4 x 8' PT present to discuss progress Seated hamstring stretch: 3x20 seconds  Seated pigeon stretch on low mat table 2x20" Lt/Rt Sit to stand with 5lb kbell chest press 1x10 Seated 5lb core series hip to hip x 20, hip to shoulder x 10 each, reverse sit up x 10 Supine pelvic tilt x10 Supine bridge with hip abd blue loop 1x10 Supine lower trunk rotation 5x10" bil Resisted backward walking x 10 5lb cable pulleys Shoulder extension and row: green band 2x10 with TA activation            PATIENT EDUCATION:  Education details: Access Code: [email or web address], walking program (09/11/22) Person educated: Patient Education method: Explanation, Demonstration, and Handouts Education comprehension: verbalized understanding and returned demonstration  HOME EXERCISE PROGRAM: Access Code: [email or web address] URL: [email or web address] Date: 09/21/2022 Prepared by: Venetia Night Dao Mearns  Exercises - Supine Lower Trunk Rotation  - 3 x daily - 7 x weekly - 1 sets - 3 reps - 20 hold - Hooklying Single Knee to Chest  - 3 x daily - 7 x weekly - 1 sets - 3 reps - 20 hold - Seated Hamstring Stretch  - 3 x daily - 7 x weekly - 1 sets - 3 reps - 20 hold - Supine Figure 4 Piriformis Stretch  - 3 x daily - 7 x weekly - 1 sets - 3 reps - 20 hold - Supine Piriformis Stretch  - 1 x daily - 7 x weekly - 3 reps - 20 hold - Supine Posterior Pelvic Tilt  - 1 x daily - 7 x weekly - 1 sets - 10 reps - Standing Row with Anchored Resistance  - 1 x daily - 7 x weekly - 1 sets - 10 reps - Standing Shoulder Extension with Resistance  - 1 x daily - 7 x weekly - 1 sets - 10 reps - Standing Plank on Wall  - 1 x daily - 7 x weekly - 1 sets - 3 reps - 10 sec hold - Wall Push Up  - 1 x daily - 7 x  weekly - 1 sets - 10 reps - Sit to Stand  - 1 x daily - 7 x weekly - 1 sets - 10 reps - Seated Hip Abduction with Resistance  - 1 x daily - 7 x weekly - 3 sets - 10 reps - Standing March  - 1 x daily - 7 x weekly - 1 sets - 20 reps Patient Education - walking program   ASSESSMENT:  CLINICAL IMPRESSION: Pt had mild LBP end of last session but it did not last.  She demos much improved LE and trunk flexibility.  She has improved postural awareness with core activation in a variety of postures and with dynamic therex.  She has much improved closed chain hip stability with glut activation in stance phase of gait and with stairs.  Continue along POC.    OBJECTIVE IMPAIRMENTS: decreased activity tolerance, decreased balance, decreased endurance, difficulty walking, decreased ROM, decreased strength, hypomobility, increased muscle spasms, impaired flexibility, improper body mechanics, postural dysfunction, and pain.   ACTIVITY LIMITATIONS: carrying, lifting, sitting, standing, bed mobility, and locomotion level  PARTICIPATION LIMITATIONS: meal prep, cleaning, laundry, shopping, community activity, and yard work  PERSONAL FACTORS: Past/current experiences and 1 comorbidity: time since onset and spinal stensis  are also affecting patient's functional outcome.   REHAB POTENTIAL: Good  CLINICAL DECISION MAKING: Stable/uncomplicated  EVALUATION COMPLEXITY: Low   GOALS: Goals reviewed with patient? Yes  SHORT TERM GOALS: Target date: 09/20/2022  Be independent in initial HEP Baseline: Goal status: met   2.  Report > or = to 30% reduction in LBP and Rt LE pain with ADLs and self-care Baseline: no pain today (09/11/22) Goal status: met  3.  Verbalize and demonstrate body mechanics modifications for spinal protection with daily tasks  Baseline:  Goal status: met  4.  Initiate a walking program and verbalize safe progression to improve endurance  Baseline: discussed program today  (09/11/22) Goal status: met    LONG TERM GOALS: Target date: 11/16/22  Be independent in advanced HEP Baseline:  Goal status: ongoing  2.  Improve FOTO to > or = to 59 Baseline: 63 Goal status: met  3.  Report > or = to 60% reduction in LBP and Rt LE pain with ADLs and self-care Baseline: 2-4/10 Goal status: met  4.  Return to gardening and verbalize modifications for rest breaks  Baseline:  Goal status: deferred due to winter, no gardening now  5.  Sit for > or = to 1 hour for longer car rides without increased LBP and Rt LE pain Baseline:  Goal status: ongoing, 45 min over Christmas    PLAN:  PT FREQUENCY: 2x/week  PT DURATION: 5 weeks  PLANNED INTERVENTIONS: Therapeutic exercises, Therapeutic activity, Neuromuscular re-education, Balance training, Gait training, Patient/Family education, Self Care, Joint mobilization, Joint manipulation, Stair training, Aquatic Therapy, Dry Needling, Electrical stimulation, Spinal manipulation, Spinal mobilization, Taping, Ultrasound, Manual therapy, and Re-evaluation.  PLAN FOR NEXT SESSION: continue hip strength, trunk mobility, functional strength, endurance  Arno Cullers, PT 11/09/22 3:35 PM

## 2022-11-14 ENCOUNTER — Ambulatory Visit: Payer: PPO | Admitting: Physical Therapy

## 2022-11-14 ENCOUNTER — Encounter: Payer: Self-pay | Admitting: Physical Therapy

## 2022-11-14 DIAGNOSIS — R252 Cramp and spasm: Secondary | ICD-10-CM

## 2022-11-14 DIAGNOSIS — M5459 Other low back pain: Secondary | ICD-10-CM

## 2022-11-14 DIAGNOSIS — M79604 Pain in right leg: Secondary | ICD-10-CM

## 2022-11-14 NOTE — Therapy (Signed)
OUTPATIENT PHYSICAL THERAPY TREATMENT       Patient Name: Deborah Adams MRN: [id number] DOB:1951/06/23, 72 y.o., female Today's Date: 11/14/2022   PT End of Session - 11/14/22 1104     Visit Number 16    Date for PT Re-Evaluation 11/16/22    Authorization Type Medicare Advantage    Progress Note Due on Visit 70    PT Start Time 1101    PT Stop Time 1145    PT Time Calculation (min) 44 min    Activity Tolerance Patient tolerated treatment well    Behavior During Therapy WFL for tasks assessed/performed                 Past Medical History:  Diagnosis Date   GERD (gastroesophageal reflux disease)    Hyperlipidemia    Hypertension    Past Surgical History:  Procedure Laterality Date   ABDOMINAL HYSTERECTOMY     BUNIONECTOMY Right    WRIST SURGERY Left    Patient Active Problem List   Diagnosis Date Noted   Thoracic back pain 12/25/2018   HTN (hypertension) 01/12/2011   Hyperlipemia 01/12/2011   GERD (gastroesophageal reflux disease) 01/12/2011   Chronic allergic rhinitis 01/12/2011   Esophageal stricture 01/12/2011   Cystocele 01/12/2011   Stress incontinence, female 01/12/2011    PCP: Lona Kettle, MD  REFERRING PROVIDER: Earnie Larsson, MD  REFERRING DIAG:  Diagnosis  M43.10 (ICD-10-CM) - Spondylolisthesis, site unspecified    Rationale for Evaluation and Treatment: Rehabilitation  THERAPY DIAG:  Other low back pain  Pain in right leg  Cramp and spasm  ONSET DATE: chronic   SUBJECTIVE:                                                                                                                                                                                           SUBJECTIVE STATEMENT: I really don't have any pain.    PERTINENT HISTORY:  HTN  PAIN:  Are you having pain? Yes: NPRS scale: 0/10   Pain location: Low back into Rt LE Pain description: aching Aggravating factors: riding in car with bumpy roads Relieving factors:  Tylenol, rest/supine  PRECAUTIONS: None  WEIGHT BEARING RESTRICTIONS: No  FALLS:  Has patient fallen in last 6 months? No  LIVING ENVIRONMENT: Lives with: lives with their spouse Lives in: House/apartment Stairs: yes  Has following equipment at home: None  OCCUPATION: retired   PLOF: Independent and Leisure: gardening  PATIENT GOALS: reduce LBP, gardening, return to walking   NEXT MD VISIT: 09/13/22  OBJECTIVE:   DIAGNOSTIC FINDINGS:  MRI: IMPRESSION: 1. No acute findings or clear explanation for  right lower extremity radicular symptoms. 2. Mild asymmetric narrowing of the left lateral recess at L3-4 with possible left L4 nerve root encroachment. 3. Mild spinal stenosis and mild asymmetric right lateral recess narrowing at L4-5 with potential extraforaminal right L4 nerve root encroachment. 4. Possible extraforaminal left L5 nerve root encroachment by disc bulging and facet hypertrophy at L5-S1.    PATIENT SURVEYS:  10/12/22: FOTO 63%, goal met 09/26/22: FOTO 56% Eval: FOTO 49 (goal is 59)  SCREENING FOR RED FLAGS: Bowel or bladder incontinence: No Spinal tumors: No Cauda equina syndrome: No Compression fracture: No Abdominal aneurysm: No  COGNITION: Overall cognitive status: Within functional limits for tasks assessed     SENSATION: WFL  MUSCLE LENGTH: Rt limited by 30%, Lt limited by 25%  POSTURE: rounded shoulders, forward head, and flexed trunk   PALPATION: Diffuse palpable tenderness over bil lumbar paraspinals and Rt>Lt gluteals.  Significant reduction in spinal mobility with PA mobs in thoracic and lumbar spine with pain L1-4.  Tender over bil SI joints  LUMBAR ROM:   AROM eval 10/12/22  Flexion Full-pain Full no pain  Extension Limited by 50%-pain Full no pain  Right lateral flexion Limited by 25%- pain Full no pain  Left lateral flexion full Full no pain  Right rotation  75%  Left rotation  50%   (Blank rows = not tested)  LOWER EXTREMITY  ROM:      10/12/22: Rt hip stiff compared to Lt into flexion, ER and IR, limited by 25% with some muscle guarding due to recent acute flare up which is now nearly fully recovered Eval: Rt hip pain with P/ROM Rt hip into flexion.  Bil hip rotation is limited by 25-50%   LOWER EXTREMITY MMT:   10/12/22: Rt hip flexion and hamstring 4+/5, 5/5 hip abd and quad Eval: 4+/5 Rt LE, 5/5 Lt LE  LUMBAR SPECIAL TESTS:  Slump test: NegativeRt is negative   GAIT: 10/12/22: 6' walk test: 1,110 feet - Rt hip fatigue and pain by last 30" 09/21/22: 6' walk test: 1,116 feet  Distance walked: 50 Assistive device utilized: None Level of assistance: Complete Independence Comments: improved arm swing, pelvic rotation, lacks trunk rotation, slight Trendelenburg Lt hip  TODAY'S TREATMENT:   11/14/22: NuStep L5 x 8' PT present to discuss status Seated hamstring stretch: 3x20 seconds  Seated pigeon stretch on low mat table 2x20" Lt/Rt Sit to stand with 3lb chest press 1x10 SLS on airex pad foot on 2nd step single UE on rail 20x3" holds for glut activation awareness Resisted sidestepping x3 each way 5lb cable pulleys Resisted backward walking x 10 5lb cable pulleys SLS on airex pad foot on 2nd step single UE on rail 20x3" holds for glut activation awareness Moist heat lumbar spine concurrent with supine exercises below: Supine lower trunk rotation 3x20" Supine bridge with hip abd yellow loop 1x10 Supine yellow band d2 flexion x10 each  11/09/22: NuStep L5 x 8' PT present to discuss status Seated trunk flexion hands on large blue physioball x 5, flexion with SB alt Lt/Rt x 3 rounds Seated hamstring stretch: 3x20 seconds  Seated pigeon stretch on low mat table 2x20" Lt/Rt Sit to stand with 3lb chest press 1x10 SLS on airex pad foot on 2nd step single UE on rail 20x3" holds for glut activation awareness Resisted backward walking x 10 5lb cable pulleys Supine lower trunk rotation 3x20" Supine bridge with hip abd  yellow loop 1x10 Supine yellow band d2 flexion x10 each Moist heat lumbar  spine end of session x 6'  11/07/22: NuStep L4 x 8' PT present to discuss progress Seated hamstring stretch: 3x20 seconds  Seated pigeon stretch on low mat table 2x20" Lt/Rt Sit to stand with 5lb kbell chest press 1x10 Seated 5lb V chest press x 10 Standing UE diagonals 3lb dumbbell 2x5 Seated trunk flexion hands on large blue physioball x 8 Resisted backward walking x 10 5lb cable pulleys Shoulder row: green band 1x20 standing on airex pad with TA activation SLS on airex pad foot on 2nd step single UE on rail 20x3" holds for glut activation awareness Seated deadlift 10lb x 10 reps Supine lower trunk rotation 5x10" bil Supine pelvic tilt 10x10" Moist heat lumbar x 8' end of session for soreness      PATIENT EDUCATION:  Education details: Access Code: [email or web address], walking program (09/11/22) Person educated: Patient Education method: Explanation, Demonstration, and Handouts Education comprehension: verbalized understanding and returned demonstration  HOME EXERCISE PROGRAM: Access Code: [email or web address] URL: [email or web address] Date: 09/21/2022 Prepared by: Venetia Night Jerris Fleer  Exercises - Supine Lower Trunk Rotation  - 3 x daily - 7 x weekly - 1 sets - 3 reps - 20 hold - Hooklying Single Knee to Chest  - 3 x daily - 7 x weekly - 1 sets - 3 reps - 20 hold - Seated Hamstring Stretch  - 3 x daily - 7 x weekly - 1 sets - 3 reps - 20 hold - Supine Figure 4 Piriformis Stretch  - 3 x daily - 7 x weekly - 1 sets - 3 reps - 20 hold - Supine Piriformis Stretch  - 1 x daily - 7 x weekly - 3 reps - 20 hold - Supine Posterior Pelvic Tilt  - 1 x daily - 7 x weekly - 1 sets - 10 reps - Standing Row with Anchored Resistance  - 1 x daily - 7 x weekly - 1 sets - 10 reps - Standing Shoulder Extension with Resistance  - 1 x daily - 7 x weekly - 1 sets - 10 reps - Standing Plank on Wall  - 1 x daily - 7 x weekly - 1 sets - 3  reps - 10 sec hold - Wall Push Up  - 1 x daily - 7 x weekly - 1 sets - 10 reps - Sit to Stand  - 1 x daily - 7 x weekly - 1 sets - 10 reps - Seated Hip Abduction with Resistance  - 1 x daily - 7 x weekly - 3 sets - 10 reps - Standing March  - 1 x daily - 7 x weekly - 1 sets - 20 reps Patient Education - walking program   ASSESSMENT:  CLINICAL IMPRESSION: Pt reports painfree state on arrival.  Her pain seems consistently under control.  She is able to do everything she wants to do and displays improved gait pattern and stability within all therex.  ERO next session with anticipated d/c to HEP.   OBJECTIVE IMPAIRMENTS: decreased activity tolerance, decreased balance, decreased endurance, difficulty walking, decreased ROM, decreased strength, hypomobility, increased muscle spasms, impaired flexibility, improper body mechanics, postural dysfunction, and pain.   ACTIVITY LIMITATIONS: carrying, lifting, sitting, standing, bed mobility, and locomotion level  PARTICIPATION LIMITATIONS: meal prep, cleaning, laundry, shopping, community activity, and yard work  PERSONAL FACTORS: Past/current experiences and 1 comorbidity: time since onset and spinal stensis  are also affecting patient's functional outcome.   REHAB POTENTIAL: Good  CLINICAL DECISION MAKING: Stable/uncomplicated  EVALUATION COMPLEXITY: Low  GOALS: Goals reviewed with patient? Yes  SHORT TERM GOALS: Target date: 09/20/2022  Be independent in initial HEP Baseline: Goal status: met   2.  Report > or = to 30% reduction in LBP and Rt LE pain with ADLs and self-care Baseline: no pain today (09/11/22) Goal status: met  3.  Verbalize and demonstrate body mechanics modifications for spinal protection with daily tasks  Baseline:  Goal status: met  4.  Initiate a walking program and verbalize safe progression to improve endurance  Baseline: discussed program today (09/11/22) Goal status: met    LONG TERM GOALS: Target  date: 11/16/22  Be independent in advanced HEP Baseline:  Goal status: ongoing  2.  Improve FOTO to > or = to 59 Baseline: 63 Goal status: met  3.  Report > or = to 60% reduction in LBP and Rt LE pain with ADLs and self-care Baseline: 2-4/10 Goal status: met  4.  Return to gardening and verbalize modifications for rest breaks  Baseline:  Goal status: deferred due to winter, no gardening now  5.  Sit for > or = to 1 hour for longer car rides without increased LBP and Rt LE pain Baseline:  Goal status: ongoing, 45 min over Christmas    PLAN:  PT FREQUENCY: 2x/week  PT DURATION: 5 weeks  PLANNED INTERVENTIONS: Therapeutic exercises, Therapeutic activity, Neuromuscular re-education, Balance training, Gait training, Patient/Family education, Self Care, Joint mobilization, Joint manipulation, Stair training, Aquatic Therapy, Dry Needling, Electrical stimulation, Spinal manipulation, Spinal mobilization, Taping, Ultrasound, Manual therapy, and Re-evaluation.  PLAN FOR NEXT SESSION: ERO with d/c next time  Baruch Merl, PT 11/14/22 12:47 PM

## 2022-11-16 ENCOUNTER — Ambulatory Visit: Payer: PPO | Admitting: Physical Therapy

## 2022-12-14 DIAGNOSIS — M4310 Spondylolisthesis, site unspecified: Secondary | ICD-10-CM | POA: Diagnosis not present

## 2022-12-28 DIAGNOSIS — M4310 Spondylolisthesis, site unspecified: Secondary | ICD-10-CM | POA: Diagnosis not present

## 2023-02-09 DIAGNOSIS — L821 Other seborrheic keratosis: Secondary | ICD-10-CM | POA: Diagnosis not present

## 2023-02-09 DIAGNOSIS — Z6831 Body mass index (BMI) 31.0-31.9, adult: Secondary | ICD-10-CM | POA: Diagnosis not present

## 2023-02-09 DIAGNOSIS — F411 Generalized anxiety disorder: Secondary | ICD-10-CM | POA: Diagnosis not present

## 2023-03-01 DIAGNOSIS — M4310 Spondylolisthesis, site unspecified: Secondary | ICD-10-CM | POA: Diagnosis not present

## 2023-03-01 DIAGNOSIS — Z6831 Body mass index (BMI) 31.0-31.9, adult: Secondary | ICD-10-CM | POA: Diagnosis not present

## 2023-04-13 ENCOUNTER — Other Ambulatory Visit: Payer: Self-pay

## 2023-04-13 ENCOUNTER — Emergency Department (HOSPITAL_BASED_OUTPATIENT_CLINIC_OR_DEPARTMENT_OTHER): Payer: PPO

## 2023-04-13 ENCOUNTER — Emergency Department (HOSPITAL_BASED_OUTPATIENT_CLINIC_OR_DEPARTMENT_OTHER)
Admission: EM | Admit: 2023-04-13 | Discharge: 2023-04-13 | Disposition: A | Discharge status: Home / Self Care | Payer: PPO | Attending: Emergency Medicine | Admitting: Emergency Medicine

## 2023-04-13 ENCOUNTER — Encounter (HOSPITAL_BASED_OUTPATIENT_CLINIC_OR_DEPARTMENT_OTHER): Payer: Self-pay | Admitting: Emergency Medicine

## 2023-04-13 DIAGNOSIS — R1031 Right lower quadrant pain: Secondary | ICD-10-CM | POA: Diagnosis not present

## 2023-04-13 DIAGNOSIS — K5792 Diverticulitis of intestine, part unspecified, without perforation or abscess without bleeding: Secondary | ICD-10-CM | POA: Diagnosis not present

## 2023-04-13 DIAGNOSIS — K449 Diaphragmatic hernia without obstruction or gangrene: Secondary | ICD-10-CM | POA: Diagnosis not present

## 2023-04-13 DIAGNOSIS — K5732 Diverticulitis of large intestine without perforation or abscess without bleeding: Secondary | ICD-10-CM | POA: Insufficient documentation

## 2023-04-13 LAB — COMPREHENSIVE METABOLIC PANEL
ALT: 12 U/L (ref 0–44)
AST: 22 U/L (ref 15–41)
Albumin: 4.5 g/dL (ref 3.5–5.0)
Alkaline Phosphatase: 58 U/L (ref 38–126)
Anion gap: 13 (ref 5–15)
BUN: 25 mg/dL — ABNORMAL HIGH (ref 8–23)
CO2: 20 mmol/L — ABNORMAL LOW (ref 22–32)
Calcium: 9.8 mg/dL (ref 8.9–10.3)
Chloride: 104 mmol/L (ref 98–111)
Creatinine, Ser: 1.48 mg/dL — ABNORMAL HIGH (ref 0.44–1.00)
GFR, Estimated: 37 mL/min — ABNORMAL LOW (ref >60)
Glucose, Bld: 123 mg/dL — ABNORMAL HIGH (ref 70–99)
Potassium: 3.7 mmol/L (ref 3.5–5.1)
Sodium: 137 mmol/L (ref 135–145)
Total Bilirubin: 0.7 mg/dL (ref 0.3–1.2)
Total Protein: 7.5 g/dL (ref 6.5–8.1)

## 2023-04-13 LAB — URINALYSIS, ROUTINE W REFLEX MICROSCOPIC
Bilirubin Urine: NEGATIVE
Glucose, UA: NEGATIVE mg/dL
Hgb urine dipstick: NEGATIVE
Nitrite: NEGATIVE
Protein, ur: 30 mg/dL — AB
Specific Gravity, Urine: 1.028 (ref 1.005–1.030)
pH: 5.5 (ref 5.0–8.0)

## 2023-04-13 LAB — CBC
HCT: 38.3 % (ref 36.0–46.0)
Hemoglobin: 12.9 g/dL (ref 12.0–15.0)
MCH: 30.3 pg (ref 26.0–34.0)
MCHC: 33.7 g/dL (ref 30.0–36.0)
MCV: 89.9 fL (ref 80.0–100.0)
Platelets: 238 10*3/uL (ref 150–400)
RBC: 4.26 MIL/uL (ref 3.87–5.11)
RDW: 13.3 % (ref 11.5–15.5)
WBC: 12.6 10*3/uL — ABNORMAL HIGH (ref 4.0–10.5)
nRBC: 0 % (ref 0.0–0.2)

## 2023-04-13 LAB — LIPASE, BLOOD: Lipase: 31 U/L (ref 11–51)

## 2023-04-13 MED ORDER — METRONIDAZOLE 500 MG PO TABS: 500.0000 mg | ORAL_TABLET | Freq: Three times a day (TID) | ORAL | 0 refills | Status: AC

## 2023-04-13 MED ORDER — ONDANSETRON HCL 4 MG PO TABS: 4.0000 mg | ORAL_TABLET | Freq: Three times a day (TID) | ORAL | 0 refills | Status: AC | PRN

## 2023-04-13 MED ORDER — METRONIDAZOLE 500 MG PO TABS
500.0000 mg | ORAL_TABLET | Freq: Once | ORAL | Status: AC
  Administered 2023-04-13: 500 mg via ORAL
  Filled 2023-04-13: qty 1

## 2023-04-13 MED ORDER — SODIUM CHLORIDE 0.9 % IV BOLUS
1000.0000 mL | Freq: Once | INTRAVENOUS | Status: AC
  Administered 2023-04-13: 1000 mL via INTRAVENOUS

## 2023-04-13 MED ORDER — CIPROFLOXACIN HCL 500 MG PO TABS: 500.0000 mg | ORAL_TABLET | Freq: Two times a day (BID) | ORAL | 0 refills | Status: AC

## 2023-04-13 MED ORDER — CIPROFLOXACIN HCL 500 MG PO TABS
500.0000 mg | ORAL_TABLET | Freq: Once | ORAL | Status: AC
  Administered 2023-04-13: 500 mg via ORAL
  Filled 2023-04-13: qty 1

## 2023-04-13 MED ORDER — FLUCONAZOLE 200 MG PO TABS: 200.0000 mg | ORAL_TABLET | Freq: Every day | ORAL | 0 refills | Status: AC

## 2023-04-13 MED ORDER — IOHEXOL 300 MG/ML  SOLN
100.0000 mL | Freq: Once | INTRAMUSCULAR | Status: AC | PRN
  Administered 2023-04-13: 75 mL via INTRAVENOUS

## 2023-04-13 NOTE — ED Triage Notes (Signed)
Pt presents to ED Pov. Pt c/o abd pain that began Tuesday. Abd pain it R&L LQ. Pt reports hot/cold flashes at home but no thermometer. Reports diarrhea

## 2023-04-13 NOTE — ED Provider Notes (Signed)
[place] EMERGENCY DEPARTMENT AT [facility] Provider Note   CSN: [id number] Arrival date & time: 04/13/23  1623     History  Chief Complaint  Patient presents with   Abdominal Pain    Deborah Adams is a 72 y.o. female scented to ED with 3 days of lower abdominal pain.  She reports suprapubic and left and right lower quadrant abdominal pain ongoing for 3 days, worse with movement.  She has not had this pain before.  She reports some urinary pressure.  Reports nausea and loss of appetite.  Regular bowel movements.  Her husband reports objective fevers at home.  She reports a history of a total hysterectomy, no other abdominal surgeries.  HPI     Home Medications Prior to Admission medications   Medication Sig Start Date End Date Taking? Authorizing Provider  ciprofloxacin (CIPRO) 500 MG tablet Take 1 tablet (500 mg total) by mouth every 12 (twelve) hours for 7 days. 04/13/23 04/20/23 Yes Korrina Zern, Kermit Balo, MD  fluconazole (DIFLUCAN) 200 MG tablet Take 1 tablet (200 mg total) by mouth daily for 1 dose. 04/13/23 04/14/23 Yes Cedrica Brune, Kermit Balo, MD  metroNIDAZOLE (FLAGYL) 500 MG tablet Take 1 tablet (500 mg total) by mouth 3 (three) times daily for 7 days. 04/13/23 04/20/23 Yes Sreenidhi Ganson, Kermit Balo, MD  ondansetron (ZOFRAN) 4 MG tablet Take 1 tablet (4 mg total) by mouth every 8 (eight) hours as needed for up to 12 days for nausea or vomiting. 04/13/23 04/25/23 Yes Austine Kelsay, Kermit Balo, MD  albuterol (VENTOLIN HFA) 108 (90 Base) MCG/ACT inhaler Inhale 1-2 puffs into the lungs every 6 (six) hours as needed for wheezing or shortness of breath. 10/08/21   Rhys Martini, PA-C  atorvastatin (LIPITOR) 20 MG tablet atorvastatin 20 mg tablet    [provider]  calcium carbonate (OS-CAL - DOSED IN MG OF ELEMENTAL CALCIUM) 1250 (500 Ca) MG tablet 1 tablet Orally Once a day    [provider]  diclofenac (VOLTAREN) 75 MG EC tablet TAKE 1 TABLET BY MOUTH TWICE A DAY 02/22/21   Lenn Sink, DPM  escitalopram (LEXAPRO) 10 MG tablet Take 10 mg by mouth daily. 08/19/21   [provider]  estradiol (ESTRACE) 1 MG tablet estradiol 1 mg tablet    [provider]  lisinopril (ZESTRIL) 40 MG tablet Take 1 tablet (40 mg total) by mouth daily. 02/17/20   Lewayne Bunting, MD  loratadine (CLARITIN) 10 MG tablet Take 10 mg by mouth daily.    [provider]  Magnesium 500 MG CAPS 1 tablet with a meal Orally one per day    [provider]  metroNIDAZOLE (METROCREAM) 0.75 % cream 1 application to affected area Externally Twice a day    [provider]  mometasone (NASONEX) 50 MCG/ACT nasal spray Place 2 sprays into the nose daily.    [provider]  omeprazole (PRILOSEC) 20 MG capsule TAKE (1) CAPSULE DAILY 06/12/14   Deatra Canter, FNP  ondansetron (ZOFRAN-ODT) 8 MG disintegrating tablet Take 1 tablet (8 mg total) by mouth every 8 (eight) hours as needed for nausea or vomiting. 10/08/21   Rhys Martini, PA-C  promethazine-dextromethorphan (PROMETHAZINE-DM) 6.25-15 MG/5ML syrup Take 2.5 mLs by mouth 4 (four) times daily as needed for cough. 10/08/21   Rhys Martini, PA-C      Allergies    Penicillins and Sulfa antibiotics    Review of Systems   Review of Systems  Physical Exam  Updated Vital Signs BP 113/67   Pulse 71   Temp 98 F (36.7 C) (Oral)   Resp 18   SpO2 97%  Physical Exam Constitutional:      General: She is not in acute distress. HENT:     Head: Normocephalic and atraumatic.  Eyes:     Conjunctiva/sclera: Conjunctivae normal.     Pupils: Pupils are equal, round, and reactive to light.  Cardiovascular:     Rate and Rhythm: Normal rate and regular rhythm.  Pulmonary:     Effort: Pulmonary effort is normal. No respiratory distress.  Abdominal:     General: There is no distension.     Tenderness: There is abdominal tenderness in the right lower quadrant, suprapubic area and left lower quadrant.  Skin:     General: Skin is warm and dry.  Neurological:     General: No focal deficit present.     Mental Status: She is alert. Mental status is at baseline.  Psychiatric:        Mood and Affect: Mood normal.        Behavior: Behavior normal.     ED Results / Procedures / Treatments   Labs (all labs ordered are listed, but only abnormal results are displayed) Labs Reviewed  COMPREHENSIVE METABOLIC PANEL - Abnormal; Notable for the following components:      Result Value   CO2 20 (*)    Glucose, Bld 123 (*)    BUN 25 (*)    Creatinine, Ser 1.48 (*)    GFR, Estimated 37 (*)    All other components within normal limits  CBC - Abnormal; Notable for the following components:   WBC 12.6 (*)    All other components within normal limits  URINALYSIS, ROUTINE W REFLEX MICROSCOPIC - Abnormal; Notable for the following components:   APPearance HAZY (*)    Ketones, ur TRACE (*)    Protein, ur 30 (*)    Leukocytes,Ua SMALL (*)    Bacteria, UA FEW (*)    All other components within normal limits  LIPASE, BLOOD    EKG None  Radiology CT ABDOMEN PELVIS W CONTRAST  Result Date: 04/13/2023 CLINICAL DATA:  Right lower quadrant abdominal pain EXAM: CT ABDOMEN AND PELVIS WITH CONTRAST TECHNIQUE: Multidetector CT imaging of the abdomen and pelvis was performed using the standard protocol following bolus administration of intravenous contrast. RADIATION DOSE REDUCTION: This exam was performed according to the departmental dose-optimization program which includes automated exposure control, adjustment of the mA and/or kV according to patient size and/or use of iterative reconstruction technique. CONTRAST:  75mL OMNIPAQUE IOHEXOL 300 MG/ML  SOLN COMPARISON:  None Available. FINDINGS: Lower chest: No acute abnormality. Hepatobiliary: There is an indeterminate 1 cm hypodense area in the right lobe of the liver image 2/18. The liver is otherwise within normal limits. Pancreas: Unremarkable. No pancreatic ductal  dilatation or surrounding inflammatory changes. Spleen: Normal in size without focal abnormality. Adrenals/Urinary Tract: Adrenal glands are unremarkable. Kidneys are normal, without renal calculi, focal lesion, or hydronephrosis. Bladder is unremarkable. Stomach/Bowel: Stomach is within normal limits. Appendix appears normal. Sigmoid colon diverticulosis is present. There is wall thickening and inflammation involving the distal sigmoid colon most compatible with acute diverticulitis. There is no evidence for perforation or abscess. There is no bowel obstruction. The and small bowel are within normal limits. There is a moderate-sized hiatal hernia. Stomach is otherwise within normal limits. Vascular/Lymphatic: Aortic atherosclerosis. No enlarged abdominal or pelvic lymph nodes. Reproductive: Status post  hysterectomy. No adnexal masses. Other: No abdominal wall hernia or abnormality. No abdominopelvic ascites. Musculoskeletal: No acute or significant osseous findings. IMPRESSION: 1. Acute uncomplicated sigmoid colon diverticulitis. 2. Moderate-sized hiatal hernia. 3. Indeterminate 1 cm hypodense lesion in the liver. Recommend further evaluation with MRI. Aortic Atherosclerosis (ICD10-I70.0). Electronically Signed   By: Darliss Cheney M.D.   On: 04/13/2023 20:13    Procedures Procedures    Medications Ordered in ED Medications  sodium chloride 0.9 % bolus 1,000 mL (0 mLs Intravenous Stopped 04/13/23 2023)  iohexol (OMNIPAQUE) 300 MG/ML solution 100 mL (75 mLs Intravenous Contrast Given 04/13/23 1944)  ciprofloxacin (CIPRO) tablet 500 mg (500 mg Oral Given 04/13/23 2130)  metroNIDAZOLE (FLAGYL) tablet 500 mg (500 mg Oral Given 04/13/23 2130)    ED Course/ Medical Decision Making/ A&P                             Medical Decision Making Amount and/or Complexity of Data Reviewed Labs: ordered. Radiology: ordered.  Risk Prescription drug management.   This patient presents to the ED with concern for lower  abdominal pain. This involves an extensive number of treatment options, and is a complaint that carries with it a high risk of complications and morbidity.  The differential diagnosis includes appendicitis versus colitis versus ureteral colic versus UTI versus other  Additional history obtained from the patient's husband at bedside  I ordered and personally interpreted labs.  The pertinent results include: Minor leukocytosis.  Minor elevation of creatinine.  I ordered imaging studies including CT abdomen pelvis I independently visualized and interpreted imaging which showed  I agree with the radiologist interpretation  I ordered medication including ciprofloxacin and Flagyl for diverticulitis; IV fluids for hydration  I have reviewed the patients home medicines and have made adjustments as needed   After the interventions noted above, I reevaluated the patient and found that they have: improved    Dispostion:  After consideration of the diagnostic results and the patients response to treatment, I feel that the patent would benefit from close outpatient follow-up  Given that the patient is tolerating p.o., does not have evidence of sepsis or perforation, and is otherwise clinically well-appearing, I do think it is reasonable to treat this as an outpatient at home.  Her husband will be taking her home.  Close return precautions were discussed worsening symptoms.  They verbalized understanding         Final Clinical Impression(s) / ED Diagnoses Final diagnoses:  Diverticulitis    Rx / DC Orders ED Discharge Orders          Ordered    ciprofloxacin (CIPRO) 500 MG tablet  Every 12 hours        04/13/23 2115    metroNIDAZOLE (FLAGYL) 500 MG tablet  3 times daily        04/13/23 2115    ondansetron (ZOFRAN) 4 MG tablet  Every 8 hours PRN        04/13/23 2115    fluconazole (DIFLUCAN) 200 MG tablet  Daily        04/13/23 2115              Terald Sleeper,  MD 04/13/23 2309

## 2023-04-18 DIAGNOSIS — Z6832 Body mass index (BMI) 32.0-32.9, adult: Secondary | ICD-10-CM | POA: Diagnosis not present

## 2023-04-18 DIAGNOSIS — F411 Generalized anxiety disorder: Secondary | ICD-10-CM | POA: Diagnosis not present

## 2023-04-18 DIAGNOSIS — K5792 Diverticulitis of intestine, part unspecified, without perforation or abscess without bleeding: Secondary | ICD-10-CM | POA: Diagnosis not present

## 2023-04-24 DIAGNOSIS — E782 Mixed hyperlipidemia: Secondary | ICD-10-CM | POA: Diagnosis not present

## 2023-04-24 DIAGNOSIS — R7303 Prediabetes: Secondary | ICD-10-CM | POA: Diagnosis not present

## 2023-04-24 DIAGNOSIS — I10 Essential (primary) hypertension: Secondary | ICD-10-CM | POA: Diagnosis not present

## 2023-04-30 DIAGNOSIS — E782 Mixed hyperlipidemia: Secondary | ICD-10-CM | POA: Diagnosis not present

## 2023-04-30 DIAGNOSIS — R7303 Prediabetes: Secondary | ICD-10-CM | POA: Diagnosis not present

## 2023-04-30 DIAGNOSIS — K219 Gastro-esophageal reflux disease without esophagitis: Secondary | ICD-10-CM | POA: Diagnosis not present

## 2023-04-30 DIAGNOSIS — N1831 Chronic kidney disease, stage 3a: Secondary | ICD-10-CM | POA: Diagnosis not present

## 2023-04-30 DIAGNOSIS — L719 Rosacea, unspecified: Secondary | ICD-10-CM | POA: Diagnosis not present

## 2023-04-30 DIAGNOSIS — F411 Generalized anxiety disorder: Secondary | ICD-10-CM | POA: Diagnosis not present

## 2023-04-30 DIAGNOSIS — I10 Essential (primary) hypertension: Secondary | ICD-10-CM | POA: Diagnosis not present

## 2023-04-30 DIAGNOSIS — Z0000 Encounter for general adult medical examination without abnormal findings: Secondary | ICD-10-CM | POA: Diagnosis not present

## 2023-07-04 DIAGNOSIS — M4310 Spondylolisthesis, site unspecified: Secondary | ICD-10-CM | POA: Diagnosis not present

## 2023-07-04 DIAGNOSIS — Z6831 Body mass index (BMI) 31.0-31.9, adult: Secondary | ICD-10-CM | POA: Diagnosis not present

## 2023-07-19 DIAGNOSIS — M25562 Pain in left knee: Secondary | ICD-10-CM | POA: Diagnosis not present

## 2023-07-19 DIAGNOSIS — M25561 Pain in right knee: Secondary | ICD-10-CM | POA: Diagnosis not present

## 2023-08-20 DIAGNOSIS — J00 Acute nasopharyngitis [common cold]: Secondary | ICD-10-CM | POA: Diagnosis not present

## 2023-08-20 DIAGNOSIS — Z03818 Encounter for observation for suspected exposure to other biological agents ruled out: Secondary | ICD-10-CM | POA: Diagnosis not present

## 2023-08-20 DIAGNOSIS — J309 Allergic rhinitis, unspecified: Secondary | ICD-10-CM | POA: Diagnosis not present

## 2023-08-20 DIAGNOSIS — R059 Cough, unspecified: Secondary | ICD-10-CM | POA: Diagnosis not present

## 2023-08-20 DIAGNOSIS — Z6831 Body mass index (BMI) 31.0-31.9, adult: Secondary | ICD-10-CM | POA: Diagnosis not present

## 2023-09-24 DIAGNOSIS — Z23 Encounter for immunization: Secondary | ICD-10-CM | POA: Diagnosis not present

## 2023-11-15 DIAGNOSIS — N390 Urinary tract infection, site not specified: Secondary | ICD-10-CM | POA: Diagnosis not present

## 2024-04-29 DIAGNOSIS — T63421A Toxic effect of venom of ants, accidental (unintentional), initial encounter: Secondary | ICD-10-CM | POA: Diagnosis not present

## 2024-05-12 DIAGNOSIS — R7303 Prediabetes: Secondary | ICD-10-CM | POA: Diagnosis not present

## 2024-05-12 DIAGNOSIS — E782 Mixed hyperlipidemia: Secondary | ICD-10-CM | POA: Diagnosis not present

## 2024-05-12 DIAGNOSIS — L719 Rosacea, unspecified: Secondary | ICD-10-CM | POA: Diagnosis not present

## 2024-05-12 DIAGNOSIS — J309 Allergic rhinitis, unspecified: Secondary | ICD-10-CM | POA: Diagnosis not present

## 2024-05-12 DIAGNOSIS — N952 Postmenopausal atrophic vaginitis: Secondary | ICD-10-CM | POA: Diagnosis not present

## 2024-05-12 DIAGNOSIS — N1831 Chronic kidney disease, stage 3a: Secondary | ICD-10-CM | POA: Diagnosis not present

## 2024-05-12 DIAGNOSIS — Z6831 Body mass index (BMI) 31.0-31.9, adult: Secondary | ICD-10-CM | POA: Diagnosis not present

## 2024-05-12 DIAGNOSIS — Z0000 Encounter for general adult medical examination without abnormal findings: Secondary | ICD-10-CM | POA: Diagnosis not present

## 2024-05-12 DIAGNOSIS — I10 Essential (primary) hypertension: Secondary | ICD-10-CM | POA: Diagnosis not present

## 2024-05-12 DIAGNOSIS — E669 Obesity, unspecified: Secondary | ICD-10-CM | POA: Diagnosis not present

## 2024-05-12 DIAGNOSIS — F411 Generalized anxiety disorder: Secondary | ICD-10-CM | POA: Diagnosis not present

## 2024-05-12 DIAGNOSIS — K219 Gastro-esophageal reflux disease without esophagitis: Secondary | ICD-10-CM | POA: Diagnosis not present

## 2024-06-05 DIAGNOSIS — Z1231 Encounter for screening mammogram for malignant neoplasm of breast: Secondary | ICD-10-CM | POA: Diagnosis not present

## 2024-06-10 DIAGNOSIS — J069 Acute upper respiratory infection, unspecified: Secondary | ICD-10-CM | POA: Diagnosis not present

## 2024-06-12 DIAGNOSIS — R0981 Nasal congestion: Secondary | ICD-10-CM | POA: Diagnosis not present

## 2024-06-12 DIAGNOSIS — R051 Acute cough: Secondary | ICD-10-CM | POA: Diagnosis not present

## 2024-06-20 DIAGNOSIS — Z6831 Body mass index (BMI) 31.0-31.9, adult: Secondary | ICD-10-CM | POA: Diagnosis not present

## 2024-06-20 DIAGNOSIS — R0981 Nasal congestion: Secondary | ICD-10-CM | POA: Diagnosis not present

## 2024-06-20 DIAGNOSIS — R051 Acute cough: Secondary | ICD-10-CM | POA: Diagnosis not present

## 2024-06-30 DIAGNOSIS — R051 Acute cough: Secondary | ICD-10-CM | POA: Diagnosis not present

## 2024-06-30 DIAGNOSIS — N390 Urinary tract infection, site not specified: Secondary | ICD-10-CM | POA: Diagnosis not present

## 2024-06-30 DIAGNOSIS — J0101 Acute recurrent maxillary sinusitis: Secondary | ICD-10-CM | POA: Diagnosis not present

## 2024-07-30 DIAGNOSIS — Z23 Encounter for immunization: Secondary | ICD-10-CM | POA: Diagnosis not present

## 2024-08-06 DIAGNOSIS — J209 Acute bronchitis, unspecified: Secondary | ICD-10-CM | POA: Diagnosis not present

## 2024-08-06 DIAGNOSIS — J0101 Acute recurrent maxillary sinusitis: Secondary | ICD-10-CM | POA: Diagnosis not present

## 2024-08-06 DIAGNOSIS — R051 Acute cough: Secondary | ICD-10-CM | POA: Diagnosis not present
